# Patient Record
Sex: Female | Born: 1965 | Race: White | Hispanic: No | Marital: Married
Health system: Midwestern US, Community
[De-identification: ages and names within clinical notes are randomized; demographics above are authoritative.]

## PROBLEM LIST (undated history)

## (undated) DIAGNOSIS — Z8709 Personal history of other diseases of the respiratory system: Secondary | ICD-10-CM

## (undated) DIAGNOSIS — N939 Abnormal uterine and vaginal bleeding, unspecified: Secondary | ICD-10-CM

## (undated) DIAGNOSIS — Z9889 Other specified postprocedural states: Secondary | ICD-10-CM

## (undated) DIAGNOSIS — D219 Benign neoplasm of connective and other soft tissue, unspecified: Secondary | ICD-10-CM

## (undated) DIAGNOSIS — R351 Nocturia: Secondary | ICD-10-CM

## (undated) DIAGNOSIS — M1990 Unspecified osteoarthritis, unspecified site: Secondary | ICD-10-CM

## (undated) DIAGNOSIS — R112 Nausea with vomiting, unspecified: Secondary | ICD-10-CM

## (undated) DIAGNOSIS — Z1231 Encounter for screening mammogram for malignant neoplasm of breast: Secondary | ICD-10-CM

## (undated) DIAGNOSIS — D259 Leiomyoma of uterus, unspecified: Secondary | ICD-10-CM

## (undated) DIAGNOSIS — D250 Submucous leiomyoma of uterus: Secondary | ICD-10-CM

## (undated) DIAGNOSIS — R928 Other abnormal and inconclusive findings on diagnostic imaging of breast: Secondary | ICD-10-CM

## (undated) DIAGNOSIS — N938 Other specified abnormal uterine and vaginal bleeding: Secondary | ICD-10-CM

## (undated) DIAGNOSIS — G8918 Other acute postprocedural pain: Secondary | ICD-10-CM

## (undated) HISTORY — DX: Benign neoplasm of connective and other soft tissue, unspecified: D21.9

---

## 1989-06-25 HISTORY — PX: SHOULDER ARTHROSCOPY: SHX128

## 2009-09-21 NOTE — Telephone Encounter (Signed)
Pt informed of mammo results.

## 2009-12-02 LAB — HCG URINE, QL. - POC: Pregnancy test,urine (POC): NEGATIVE

## 2009-12-02 MED ORDER — LIDOCAINE (PF) 10 MG/ML (1 %) IJ SOLN
10 mg/mL (1 %) | INTRAMUSCULAR | Status: DC | PRN
Start: 2009-12-02 — End: 2009-12-02

## 2009-12-02 MED ORDER — CEFAZOLIN 1 GRAM SOLUTION FOR INJECTION
1 gram | Freq: Once | INTRAMUSCULAR | Status: DC
Start: 2009-12-02 — End: 2009-12-02

## 2009-12-02 MED ORDER — NALOXONE 0.4 MG/ML INJECTION
0.4 mg/mL | INTRAMUSCULAR | Status: DC | PRN
Start: 2009-12-02 — End: 2009-12-02

## 2009-12-02 MED ORDER — FLUMAZENIL 0.1 MG/ML IV SOLN
0.1 mg/mL | INTRAVENOUS | Status: DC | PRN
Start: 2009-12-02 — End: 2009-12-02

## 2009-12-02 MED ORDER — BUPIVACAINE (PF) 0.5 % (5 MG/ML) IJ SOLN
0.5 % (5 mg/mL) | Freq: Once | INTRAMUSCULAR | Status: AC | PRN
Start: 2009-12-02 — End: 2009-12-02
  Administered 2009-12-02: 16:00:00 via SUBCUTANEOUS

## 2009-12-02 MED ORDER — HYDROCODONE-ACETAMINOPHEN 5 MG-500 MG TAB
5-500 mg | Freq: Once | ORAL | Status: AC
Start: 2009-12-02 — End: 2009-12-02
  Administered 2009-12-02: 18:00:00 via ORAL

## 2009-12-02 MED ORDER — HYDROCODONE-ACETAMINOPHEN 5 MG-500 MG TAB
5-500 mg | ORAL_TABLET | ORAL | Status: DC | PRN
Start: 2009-12-02 — End: 2012-09-15

## 2009-12-02 MED ORDER — ONDANSETRON (PF) 4 MG/2 ML INJECTION
4 mg/2 mL | INTRAMUSCULAR | Status: DC | PRN
Start: 2009-12-02 — End: 2009-12-02

## 2009-12-02 MED ADMIN — lactated ringers infusion: INTRAVENOUS | @ 15:00:00 | NDC 00409795309

## 2009-12-02 MED ADMIN — cefazolin (ANCEF) 1 g in 0.9% sodium chloride (MBP/ADV) 50 mL MBP: INTRAVENOUS | @ 16:00:00 | NDC 00781345170

## 2009-12-02 MED FILL — BUPIVACAINE (PF) 0.5 % (5 MG/ML) IJ SOLN: 0.5 % (5 mg/mL) | INTRAMUSCULAR | Qty: 30

## 2009-12-02 MED FILL — MORPHINE 10 MG/ML SYRINGE: 10 mg/mL | INTRAMUSCULAR | Qty: 1

## 2009-12-02 MED FILL — HYDROCODONE-ACETAMINOPHEN 5 MG-500 MG TAB: 5-500 mg | ORAL | Qty: 1

## 2009-12-02 MED FILL — MIDAZOLAM 1 MG/ML IJ SOLN: 1 mg/mL | INTRAMUSCULAR | Qty: 2

## 2009-12-02 MED FILL — LACTATED RINGERS IV: INTRAVENOUS | Qty: 1000

## 2009-12-02 MED FILL — CEFAZOLIN 1 GRAM SOLUTION FOR INJECTION: 1 gram | INTRAMUSCULAR | Qty: 1000

## 2009-12-02 MED FILL — BUPIVACAINE-EPINEPHRINE (PF) 0.5 %-1:200,000 IJ SOLN: 0.5 %-1:200,000 | INTRAMUSCULAR | Qty: 30

## 2009-12-02 MED FILL — FENTANYL CITRATE (PF) 50 MCG/ML IJ SOLN: 50 mcg/mL | INTRAMUSCULAR | Qty: 2

## 2009-12-02 NOTE — Progress Notes (Signed)
Post-Anesthesia Evaluation & Assessment    Visit Vitals   Item Reading   ??? BP 146/30   ??? Pulse 52   ??? Temp 97.7 ??F (36.5 ??C)   ??? Resp 12   ??? Ht 5\' 4"  (1.626 m)   ??? Wt 135 lb 4 oz (61.349 kg)   ??? BMI 23.22 kg/m2   ??? SpO2 100%         Nausea/Vomiting: no nausea and no vomiting    Post-operative hydration adequate.    Pain score (VAS): 2    Mental status & Level of consciousness: alert and oriented x 3    Neurological status: moves all extremities, sensation grossly intact    Pulmonary status: airway patent, no supplemental oxygen required    Complications related to anesthesia: none    Patient has met all discharge requirements.    Additional comments:        Satira Mccallum, MD

## 2009-12-02 NOTE — Op Note (Signed)
#  [id number]  Dictated

## 2009-12-02 NOTE — Brief Op Note (Signed)
BRIEF OPERATIVE NOTE    Date of Procedure: 12/02/2009   Preoperative Diagnosis: UMBILICAL HERNIA  Postoperative Diagnosis: UMBILICAL HERNIA    Procedure:  HERNIA UMBILICAL REPAIR - REPAIR UMBILICAL HERNIA    Surgeon: Michel Bickers, MD  Assistant(s):    Anesthesia: General   Estimated Blood Loss: minimal  Specimens:   ID Type Source Tests Collected by Time Destination   1 : hernia sac Preservative Hernia Sac  Michel Bickers 12/02/2009 1202 Pathology      Findings: See full operative note.  Complications: none  Implants: * No implants in log *

## 2009-12-02 NOTE — Other (Signed)
1115 forced warm air gown on patient.  Patient controlled temperature device explained and given to patient.

## 2009-12-02 NOTE — Progress Notes (Signed)
12/02/2009    Chart reviewed and patient interviewed. No new changes since current H&P.

## 2009-12-02 NOTE — Op Note (Signed)
Name: Laura Bradley, Laura Bradley  MR #: [id number] Surgeon: Barbette Or,   MD  Account #: [id number] Surgery Date: 12/02/2009  DOB: 01-Dec-1965  Age: 44 Location: [facility]     OPERATIVE REPORT        POSTOPERATIVE DIAGNOSIS: Umbilical hernia.    POSTOPERATIVE DIAGNOSIS: Umbilical hernia.    OPERATIVE PROCEDURE: Repair of umbilical hernia.    SURGEON: Barbette Or, MD    ANESTHESIA: General. Anesthesiologist Dr. ________ .    COMPLICATIONS    ESTIMATED BLOOD LOSS: Minimal.    FINDINGS: The patient had a small umbilical hernia containing omentum. The  defect in the fascia was less than 5 mm in diameter.    DESCRIPTION OF PROCEDURE: Under general anesthesia, the patient was supine  on the operating table. The abdomen was cleaned, prepped, and draped.    A subumbilical incision was made and deepened through subcutaneous tissue.  The hernia was clearly identified. The hernia was separated from the  fascia. The margins of the fascia were clearly defined. The sac was opened  and excess sac and omentum was excised and sent to histopathology.    The margins of the fascial defect were cleared and it was noted that the  defect was very small and therefore it was chosen not to use mesh. Primary  repair with figure-of-eight suture of 0 PDS was performed.    The umbilical dimple was refashioned and the wound was closed with  subcutaneous 3-0 Monocryl and subcuticular 4-0 Monocryl with Dermabond.    The patient tolerated the procedure well.    Counts were correct at the completion of surgery.    SPECIMENS EXCISED: Sac.              Barbette Or, MD    cc: Barbette Or, MD        VS/wmx; D: 12/02/2009 12:09 P; T: 12/02/2009 1:32 P; DOC# [id number]; Job#  [id number]

## 2009-12-02 NOTE — Op Note (Signed)
#  [id number]  Dictated

## 2009-12-02 NOTE — Op Note (Signed)
Op Notes signed by Michel Bickers, MD at 12/06/09 1025                 Author: Michel Bickers, MD  Service: --  Author Type: Physician       Filed: 12/06/09 1025  Date of Service: 12/02/09 1332  Status: Signed          Editor: Michel Bickers, MD (Physician)          <!--EPICS--> <BR> Name:      Laura Bradley, Laura Bradley<BR> MR #:      [id number]                    Surgeon:        Barbette Or, <BR> MD<BR> Account  #: [id number]                 Surgery Date:   12/02/2009<BR> DOB:       1965/11/10<BR> Age:       44                           Location:       [facility]<BR> <BR>                              OPERATIVE REPORT<BR> <BR> <BR> <BR> POSTOPERATIVE DIAGNOSIS:   Umbilical hernia.<BR> <BR> POSTOPERATIVE DIAGNOSIS:  Umbilical hernia.<BR> <BR> OPERATIVE PROCEDURE:  Repair of umbilical hernia.<BR> <BR> SURGEON:  Venkataraman Jaiyla Granados, MD<BR> <BR> ANESTHESIA:  General. Anesthesiologist Dr. ________ .<BR> <BR> COMPLICATIONS<BR>  <BR> ESTIMATED BLOOD LOSS:  Minimal.<BR> <BR> FINDINGS:  The patient had a small umbilical hernia containing omentum. The<BR> defect in the fascia was less than 5 mm in diameter.<BR> <BR> DESCRIPTION OF PROCEDURE:  Under general anesthesia, the patient  was supine<BR> on the operating table. The abdomen was cleaned, prepped, and draped.<BR> <BR> A subumbilical incision was made and deepened through subcutaneous tissue.<BR> The hernia was clearly identified. The hernia was separated from the<BR> fascia.  The margins of the fascia were clearly defined. The sac was opened<BR> and excess sac and omentum was excised and sent to histopathology.<BR> <BR> The margins of the fascial defect were cleared and it was noted that the<BR> defect was very small and therefore  it was chosen not to use mesh. Primary<BR> repair with figure-of-eight suture of 0 PDS was performed.<BR> <BR> The umbilical dimple was refashioned and the wound was closed with<BR> subcutaneous 3-0 Monocryl and subcuticular 4-0  Monocryl with Dermabond.<BR>  <BR> The patient tolerated the procedure well.<BR> <BR> Counts were correct at the completion of surgery.<BR> <BR> SPECIMENS EXCISED:  Sac.<BR> <BR> <BR> <BR> <BR> <BR> <BR> Venkataraman Khloei Spiker, MD<BR> <BR> cc:   Barbette Or, MD<BR> <BR> <BR>  <BR> VS/wmx; D: 12/02/2009 12:09 P; T: 12/02/2009  1:32 P; DOC# [id number]; Job#<BR> [id number]<BR> <!--EPICE-->

## 2009-12-03 MED FILL — DIPRIVAN 10 MG/ML INTRAVENOUS EMULSION: 10 mg/mL | INTRAVENOUS | Qty: 20

## 2011-09-03 ENCOUNTER — Encounter

## 2012-06-25 HISTORY — PX: HAND TENDON SURGERY: SHX663

## 2012-09-15 MED ADMIN — lidocaine (XYLOCAINE) 10 mg/mL (1 %) injection 20 mL: INTRADERMAL | @ 16:00:00 | NDC 00409427601

## 2012-09-15 MED ADMIN — diph,Pertuss(AC),Tet Vac-PF (BOOSTRIX) suspension 0.5 mL: INTRAMUSCULAR | @ 16:00:00 | NDC 58160084243

## 2012-09-15 MED ADMIN — neomycin-bacitracin-polymyxin (NEOSPORIN) ointment 1 Packet: TOPICAL | @ 17:00:00 | NDC 45802006170

## 2012-09-15 NOTE — ED Provider Notes (Signed)
HPI Comments: 47 year old female states that she cut finger on paper cutter no N/T still has ROM to 5th finger on the right     Patient is a 47 y.o. female presenting with skin laceration. The history is provided by the patient.   Laceration   The incident occurred less than 1 hour ago. Pain location: rigth 5th finger. The laceration is 2 cm in size. Injury mechanism: paper cutter. The pain is at a severity of 3/10. The pain is mild. Pertinent negatives include no numbness. The patient's last tetanus shot was 5 to 10 years ago.       Past Medical History   Diagnosis Date   ??? Nausea & vomiting         Past Surgical History   Procedure Laterality Date   ??? Hx gyn     ??? Hx gi       hernia   ??? Hx orthopaedic       shoulder         Family History   Problem Relation Age of Onset   ??? Diabetes Father    ??? Heart Disease Maternal Grandmother    ??? Stroke Maternal Grandfather         History     Social History   ??? Marital Status: MARRIED     Spouse Name: N/A     Number of Children: N/A   ??? Years of Education: N/A     Occupational History   ??? Not on file.     Social History Main Topics   ??? Smoking status: Never Smoker    ??? Smokeless tobacco: Never Used   ??? Alcohol Use: 0.5 oz/week     1 Glasses of wine per week   ??? Drug Use: No   ??? Sexually Active: Not on file     Other Topics Concern   ??? Not on file     Social History Narrative   ??? No narrative on file                  ALLERGIES: Percodan      Review of Systems   Constitutional: Negative for appetite change and unexpected weight change.   HENT: Negative for facial swelling, rhinorrhea, trouble swallowing, neck pain, dental problem and ear discharge.    Eyes: Negative for pain and discharge.   Respiratory: Negative for apnea and stridor.    Cardiovascular: Negative for chest pain, palpitations and leg swelling.   Gastrointestinal: Negative for vomiting, abdominal pain, diarrhea, blood in stool and abdominal distention.   Genitourinary: Negative for dysuria, hematuria, flank pain  and difficulty urinating.   Musculoskeletal: Negative for myalgias, back pain, joint swelling and arthralgias.   Skin: Positive for wound. Negative for color change and rash.   Neurological: Negative for facial asymmetry, speech difficulty, numbness and headaches.   Hematological: Negative for adenopathy.   Psychiatric/Behavioral: Negative for suicidal ideas, hallucinations, behavioral problems, self-injury and agitation.       Filed Vitals:    09/15/12 1058   BP: 149/73   Pulse: 69   Temp: 98.1 ??F (36.7 ??C)   Resp: 18   Height: 5\' 4"  (1.626 m)   Weight: 62.596 kg (138 lb)   SpO2: 97%            Physical Exam   Nursing note and vitals reviewed.  Constitutional: She is oriented to person, place, and time. She appears well-developed and well-nourished. She appears distressed.   HENT:  Head: Normocephalic and atraumatic.   Eyes: Conjunctivae and EOM are normal. Pupils are equal, round, and reactive to light. Right eye exhibits no discharge. Left eye exhibits no discharge. No scleral icterus.   Neck: Normal range of motion.   Musculoskeletal: Normal range of motion. She exhibits tenderness. She exhibits no edema.        Right hand: She exhibits laceration.        Hands:  Neurological: She is alert and oriented to person, place, and time. No cranial nerve deficit. Coordination normal.   Skin: Skin is warm. No erythema.        MDM     Amount and/or Complexity of Data Reviewed:   Discussion of test results with the performing providers:  Yes  Progress:   Patient progress:  Stable      Wound Repair  Date/Time: 09/15/2012 12:12 PM  Performed by: PASupervising provider: Leeroy Bock  Preparation: skin prepped with Shur-Clens  Location details: right small finger  Wound length:2.5 cm or less  Anesthesia: local infiltration  Local anesthetic: lidocaine 1% without epinephrine  Anesthetic total: 4 ml  Irrigation solution: saline  Irrigation method: syringe  Skin closure: 5-0 nylon  Number of sutures: 7  Technique: simple and  interrupted  Approximation: close  Dressing: antibiotic ointment and splint  Patient tolerance: Patient tolerated the procedure well with no immediate complications.  My total time at bedside, performing this procedure was 16-30 minutes.        Will follow up with with ortho for further follow up will splint with finger splint and dress wound

## 2012-09-15 NOTE — ED Provider Notes (Signed)
I was personally available for consultation in the emergency department.  I have reviewed the chart and agree with the documentation recorded by the MLP, including the assessment, treatment plan, and disposition.  Makana Rostad H Amamda Curbow, MD

## 2012-09-15 NOTE — ED Notes (Signed)
I have reviewed discharge instructions with the patient. The patient verbalized understanding. Opportunity to ask questions given. No questions at this time.  Script(s) given to patient.  Armband removed and confidentially discarded.    Patient ambulatory out of ED.

## 2012-09-15 NOTE — ED Notes (Signed)
Dry dressing with finger splint applied.

## 2012-09-15 NOTE — ED Notes (Signed)
Pt states she 'sliced her finger with a large paper cutter' this morning.  Pt c/o laceration to right 5th finger, bleeding controlled in triage, pt has applied gauze wrap PTA. Pt states her last tetanus with greater than 5 years ago.

## 2012-12-16 ENCOUNTER — Encounter

## 2012-12-17 NOTE — Progress Notes (Signed)
Annual exam ages 95-64    Laura Bradley is a G4 P2022,  47 y.o. female WHITE [employer] whose Patient's last menstrual period was 11/25/2012.  was on 11/25/2012 who presents for her annual checkup.     She is having BTB and RLQ Pain.  Has discharge that is pink tinged - has had this for last few years, no change.  Occurs midcycle.  RLQ pain is also midcycle.  Both c/w ovulation.  Had US done 10/2011 which showed nl ovaries bilaterally.  Uterine fibroid ~5cm, ?submucosal.    With regard to the Gardasil vaccine, she is older than the age for which it is FDA approved.    Menstrual status:    Her periods are moderate in flow, occasionally heavy with clots. She is using "depending on how heavy" pads or tampons per day, current BTB x 4 days - spotty d/c per pt. Midcycle as above.    She has dysmenorrhea.    She has premenstrual symptoms.    Contraception:    The current method of family planning is vasectomy.    Sexual history:     reports that she currently engages in sexual activity. She reports using the following method of birth control/protection: None.    Medical conditions:    Since her last annual GYN exam about one year ago,  she has the following changes in her health history: none.     Pap and Mammogram History:    Her most recent Pap smear was normal, HPV was negative, obtained on 09/02/08.    The patient had a recent mammogram yesterday which results are pending from [facility].    Osteoporosis History:    Family history does not include a first or second degree relative with osteopenia or osteoporosis.      Past Medical History   Diagnosis Date   ??? Nausea & vomiting    ??? Screening for malignant neoplasm of the cervix 09/02/08     Negative ; HPV Negative   ??? Hx of mammogram 12/16/12     Results pending   ??? Infertility      H/o   ??? Ectopic pregnancy      H/o x 2 on right side     Past Surgical History   Procedure Laterality Date   ??? Hx gi       hernia   ??? Hx orthopaedic       shoulder   ??? Hx cesarean  section       x 2     OB History    Grav Para Term Preterm Abortions TAB SAB Ect Mult Living    4 2 2  2   2  2        # Outc Date GA Lbr Len/2nd Wgt Sex Del Anes PTL Lv    1 TRM 5/97 [redacted]w[redacted]d  9lb9oz(4.338kg) F C-SECTION CL Spinal No Yes    Comments: Breech    2 TRM 9/00 [redacted]w[redacted]d  8lb3oz(3.714kg) M C-SECTION CL Spinal No Yes    Comments: Breech    3 ECT             4 ECT                   Current Outpatient Prescriptions   Medication Sig Dispense Refill   ??? magnesium 250 mg tab Take  by mouth.       ??? HYDROcodone-acetaminophen (NORCO) 5-325 mg per tablet Take 1 Tab by mouth  every six (6) hours as needed for Pain.  12 Tab  0   ??? magnesium oxide (MAG-OX) 400 mg tablet Take 400 mg by mouth daily.       ??? multivitamin, stress formula (STRESS TAB) tablet Take 1 Tab by mouth daily.         Allergies: Percodan   History     Social History   ??? Marital Status: MARRIED     Spouse Name: N/A     Number of Children: N/A   ??? Years of Education: N/A     Occupational History   ??? Not on file.     Social History Main Topics   ??? Smoking status: Never Smoker    ??? Smokeless tobacco: Never Used   ??? Alcohol Use: 0.5 oz/week     1 Glasses of wine per week   ??? Drug Use: No   ??? Sexually Active: Yes -- Female partner(s)     Birth Control/ Protection: None      Comment: Vasectomy     Other Topics Concern   ??? Not on file     Social History Narrative   ??? No narrative on file     Tobacco History:  reports that she has never smoked. She has never used smokeless tobacco.  Alcohol Abuse:  reports that she drinks about 0.5 ounces of alcohol per week.  Drug Abuse:  reports that she does not use illicit drugs.    There is no problem list on file for this patient.    Family History   Problem Relation Age of Onset   ??? Diabetes Father    ??? Heart Disease Maternal Grandmother    ??? Stroke Maternal Grandfather    ??? Breast Cancer Neg Hx        Review of Systems - History obtained from the patient  Constitutional: negative for weight loss, fever, night sweats  HEENT:  negative for hearing loss, earache, congestion, snoring, sorethroat  CV: negative for chest pain, palpitations, edema  Resp: negative for cough, shortness of breath, wheezing  GI: negative for change in bowel habits, abdominal pain, black or bloody stools  GU: negative for frequency, dysuria, hematuria, vaginal discharge  MSK: negative for back pain, joint pain, muscle pain  Breast: negative for breast lumps, nipple discharge, galactorrhea  Skin :negative for itching, rash, hives  Neuro: negative for dizziness, headache, confusion, weakness  Psych: negative for anxiety, depression, change in mood  Heme/lymph: negative for bleeding, bruising, pallor    Physical Exam    BP 122/62   Ht 5\' 4"  (1.626 m)   Wt 138 lb (62.596 kg)   BMI 23.68 kg/m2   LMP 11/25/2012    Constitutional  ?? Appearance: well-nourished, well developed, alert, in no acute distress    HENT  ?? Head and Face: appears normal    Neck  ?? Inspection/Palpation: normal appearance, no masses or tenderness  ?? Lymph Nodes: no lymphadenopathy present  ?? Thyroid: gland size normal, nontender, no nodules or masses present on palpation    Chest  ?? Respiratory Effort: breathing unlabored  ?? Auscultation: normal breath sounds    Cardiovascular  ?? Heart:  ?? Auscultation: regular rate and rhythm without murmur    Breasts  ?? Inspection of Breasts: breasts symmetrical, no skin changes, no discharge present, nipple appearance normal, no skin retraction present  ?? Palpation of Breasts and Axillae: no masses present on palpation, no breast tenderness  ?? Axillary Lymph Nodes: no lymphadenopathy present  Gastrointestinal  ?? Abdominal Examination: abdomen non-tender to palpation, normal bowel sounds, no masses present  ?? Liver and spleen: no hepatomegaly present, spleen not palpable  ?? Hernias: no hernias identified    Genitourinary  ?? External Genitalia: normal appearance for age, no discharge present, no tenderness present, no inflammatory lesions present, no masses  present, no atrophy present  ?? Vagina: normal vaginal vault without central or paravaginal defects, no discharge present, no inflammatory lesions present, no masses present  ?? Bladder: non-tender to palpation  ?? Urethra: appears normal  ?? Cervix: normal   ?? Uterus: upper normal size, with right fundal fibroid  ?? Adnexa: no adnexal tenderness present, no adnexal masses present  ?? Perineum: perineum within normal limits, no evidence of trauma, no rashes or skin lesions present  ?? Anus: anus within normal limits, no hemorrhoids present  ?? Inguinal Lymph Nodes: no lymphadenopathy present    Skin  ?? General Inspection: no rash, no lesions identified    Neurologic/Psychiatric  ?? Mental Status:  ?? Orientation: grossly oriented to person, place and time  ?? Mood and Affect: mood normal, affect appropriate    Assessment & Plan:  ?? Routine gynecologic examination.  Pap/HPV done.  ?? Her current medical status is satisfactory with no evidence of significant gynecologic issues.  ?? Some midcycle increased discharge, slight pinkish, and RLQ discomfort, all c/w ovulation  ?? Counseled re: diet, exercise, healthy lifestyle  ?? Return for yearly wellness visits  ?? Rec annual mammogram.  Done yesterday.           Orders Placed This Encounter   ??? PAP IG, HPV AND RFX HPV [phone])

## 2012-12-20 LAB — PAP IG, HPV AND RFX HPV 16/18,45(507815)
.: 0
HPV DNA Probe, High Risk: NEGATIVE
HPV, High Risk: NEGATIVE
LABCORP 019018: 0

## 2014-01-08 ENCOUNTER — Encounter

## 2014-01-19 NOTE — Progress Notes (Signed)
Annual exam ages 42-39    Laura Bradley is a G4 P2022,  48 y.o. female [place] No LMP recorded., who presents for her annual checkup.     She is having no significant problems.  Periods are better.  Using essential oils which have helped.    With regard to the Gardasil vaccine, she is older than the FDA approved age to receive it.      Menstrual status:    Her periods are light, moderate in flow. She is using three to five pads or tampons per day, usually regular and last 26-30 days.    She denies dysmenorrhea.    She denies premenstrual symptoms.    Contraception:    The current method of family planning is vasectomy.    Sexual history:     She  reports that she currently engages in sexual activity and has had female partners. She reports using the following method of birth control/protection: None..    Medical conditions:    Since her last annual GYN exam about one year ago, she has not the following changes in her health history: none.     Pap and Mammogram History:    Her most recent Pap smear was normal, HPV was negative, obtained on 12/17/12.  She does not have a history of abnormal paps.    The patient had a recent mammogram on 12/16/12 which was negative for malignancy.  Has appt scheduled for next week.    Breast Cancer History/Substance Abuse:    Past Medical History   Diagnosis Date   ??? Nausea & vomiting    ??? Screening for malignant neoplasm of the cervix 12/17/12     Negative ; HPV Negative   ??? Hx of mammogram 12/16/12     Results pending   ??? Infertility      H/o   ??? Ectopic pregnancy      H/o x 2 on right side   ??? Anemia    ??? Pre-diabetes    ??? Vitamin D deficiency      Past Surgical History   Procedure Laterality Date   ??? Hx gi       hernia   ??? Hx orthopaedic       shoulder   ??? Hx cesarean section       x 2     OB History   Gravida Para Term Preterm AB SAB TAB Ectopic Multiple Living   4 2 2  2   2  2       # Outcome Date GA Lbr Len/2nd Weight Sex Delivery Anes PTL Lv    4 Term 03/09/99 [redacted]w[redacted]d  8 lb 3 oz (3.714 kg) M C-SECTION CL Spinal N Y      Comments: Breech   3 Term 11/02/95 [redacted]w[redacted]d  9 lb 9 oz (4.338 kg) F C-SECTION CL Spinal N Y      Comments: Breech   2 Ectopic            1 Ectopic                   Current Outpatient Prescriptions   Medication Sig Dispense Refill   ??? cholecalciferol, vitamin D3, (VITAMIN D3) 2,000 unit tab Take  by mouth.     ??? ferrous sulfate (IRON) 325 mg (65 mg iron) tablet Take  by mouth Daily (before breakfast).     ??? magnesium 250 mg tab Take  by mouth.     ???  magnesium oxide (MAG-OX) 400 mg tablet Take 400 mg by mouth daily.     ??? multivitamin, stress formula (STRESS TAB) tablet Take 1 Tab by mouth daily.     ??? HYDROcodone-acetaminophen (NORCO) 5-325 mg per tablet Take 1 Tab by mouth every six (6) hours as needed for Pain. 12 Tab 0     Allergies: Percodan   History     Social History   ??? Marital Status: MARRIED     Spouse Name: N/A     Number of Children: N/A   ??? Years of Education: N/A     Occupational History   ??? Not on file.     Social History Main Topics   ??? Smoking status: Never Smoker    ??? Smokeless tobacco: Never Used   ??? Alcohol Use: 0.5 oz/week     1 Glasses of wine per week   ??? Drug Use: No   ??? Sexual Activity:     Partners: Male     Patent examiner Protection: None      Comment: Vasectomy     Other Topics Concern   ??? Not on file     Social History Narrative     Tobacco History:  reports that she has never smoked. She has never used smokeless tobacco.  Alcohol Abuse:  reports that she drinks about 0.5 oz of alcohol per week.  Drug Abuse:  reports that she does not use illicit drugs.    There is no problem list on file for this patient.    Family History   Problem Relation Age of Onset   ??? Diabetes Father    ??? Heart Disease Maternal Grandmother    ??? Stroke Maternal Grandfather    ??? Breast Cancer Neg Hx        Review of Systems - History obtained from the patient  Constitutional: negative for weight loss, fever, night sweats   HEENT: negative for hearing loss, earache, congestion, snoring, sorethroat  CV: negative for chest pain, palpitations, edema  Resp: negative for cough, shortness of breath, wheezing  GI: negative for change in bowel habits, abdominal pain, black or bloody stools  GU: negative for frequency, dysuria, hematuria, vaginal discharge  MSK: negative for back pain, joint pain, muscle pain  Breast: negative for breast lumps, nipple discharge, galactorrhea  Skin :negative for itching, rash, hives  Neuro: negative for dizziness, headache, confusion, weakness  Psych: negative for anxiety, depression, change in mood  Heme/lymph: negative for bleeding, bruising, pallor    Physical Exam    BP 122/64 mmHg   Ht 5\' 4"  (1.626 m)   Wt 140 lb (63.504 kg)   BMI 24.02 kg/m2   LMP 01/09/2014 (Exact Date)    Constitutional  ?? Appearance: well-nourished, well developed, alert, in no acute distress    HENT  ?? Head and Face: appears normal    Neck  ?? Inspection/Palpation: normal appearance, no masses or tenderness  ?? Lymph Nodes: no lymphadenopathy present  ?? Thyroid: gland size normal, nontender, no nodules or masses present on palpation    Chest  ?? Respiratory Effort: breathing unlabored  ?? Auscultation: normal breath sounds    Cardiovascular  ?? Heart:  ?? Auscultation: regular rate and rhythm without murmur    Breasts  ?? Inspection of Breasts: breasts symmetrical, no skin changes, no discharge present, nipple appearance normal, no skin retraction present  ?? Palpation of Breasts and Axillae: no masses present on palpation, no breast tenderness  ?? Axillary Lymph Nodes:  no lymphadenopathy present    Gastrointestinal  ?? Abdominal Examination: abdomen non-tender to palpation, normal bowel sounds, no masses present  ?? Liver and spleen: no hepatomegaly present, spleen not palpable  ?? Hernias: no hernias identified    Genitourinary  ?? External Genitalia: normal appearance for age, no discharge present, no  tenderness present, no inflammatory lesions present, no masses present, no atrophy present  ?? Vagina: normal vaginal vault without central or paravaginal defects, no discharge present, no inflammatory lesions present, no masses present  ?? Bladder: non-tender to palpation  ?? Urethra: appears normal  ?? Cervix: normal   ?? Uterus: normal size, shape and consistency  ?? Adnexa: no adnexal tenderness present, no adnexal masses present  ?? Perineum: perineum within normal limits, no evidence of trauma, no rashes or skin lesions present  ?? Anus: anus within normal limits, no hemorrhoids present  ?? Inguinal Lymph Nodes: no lymphadenopathy present    Skin  ?? General Inspection: no rash, no lesions identified    Neurologic/Psychiatric  ?? Mental Status:  ?? Orientation: grossly oriented to person, place and time  ?? Mood and Affect: mood normal, affect appropriate        Assessment & Plan:  ?? Routine gynecologic examination.  Pap/HPV neg 11/2012, rpt 15yrs.  ?? Her current medical status is satisfactory with no evidence of significant gynecologic issues.  ?? Counseled re: diet, exercise, healthy lifestyle  ?? Return for yearly wellness visits  ?? Pt counseled regarding co-testing for high risk HPV with pap  ?? Rec screening mammo.  Scheduled for next week

## 2014-01-19 NOTE — Patient Instructions (Signed)
Well Visit, Ages 18 to 50: After Your Visit  Your Care Instructions  Physical exams can help you stay healthy. Your doctor has checked your overall health and may have suggested ways to take good care of yourself. He or she also may have recommended tests. At home, you can help prevent illness with healthy eating, regular exercise, and other steps.  Follow-up care is a key part of your treatment and safety. Be sure to make and go to all appointments, and call your doctor if you are having problems. It's also a good idea to know your test results and keep a list of the medicines you take.  How can you care for yourself at home?  ?? Reach and stay at a healthy weight. This will lower your risk for many problems, such as obesity, diabetes, heart disease, and high blood pressure.  ?? Get at least 30 minutes of physical activity on most days of the week. Walking is a good choice. You also may want to do other activities, such as running, swimming, cycling, or playing tennis or team sports. Discuss any changes in your exercise program with your doctor.  ?? Do not smoke or allow others to smoke around you. If you need help quitting, talk to your doctor about stop-smoking programs and medicines. These can increase your chances of quitting for good.  ?? Talk to your doctor about whether you have any risk factors for sexually transmitted infections (STIs). Having one sex partner (who does not have STIs and does not have sex with anyone else) is a good way to avoid these infections.  ?? Use birth control if you do not want to have children at this time. Talk with your doctor about the choices available and what might be best for you.  ?? Always wear sunscreen on exposed skin. Make sure the sunscreen blocks ultraviolet rays (both UVA and UVB) and has a sun protection factor (SPF) of at least 15. Use it every day, even when it is cloudy. Some doctors may recommend a higher SPF, such as 30.   ?? See a dentist one or two times a year for checkups and to have your teeth cleaned.  ?? Wear a seat belt in the car.  ?? Drink alcohol in moderation, if at all. That means no more than 2 drinks a day for men and 1 drink a day for women.  Follow your doctor's advice about when to have certain tests. These tests can spot problems early.  For everyone  ?? Cholesterol. Have the fat (cholesterol) in your blood tested after age 20. Your doctor will tell you how often to have this done based on your age, family history, or other things that can increase your risk for heart disease.  ?? Blood pressure. Experts suggest that healthy adults with normal blood pressure (119/79 mm Hg or below) have their blood pressure checked at least every 1 to 2 years. This can be done during a routine doctor visit. If you have slightly higher or high blood pressure, your doctor will suggest more frequent tests.  ?? Vision. Talk with your doctor about how often to have a glaucoma test.  ?? Diabetes. Ask your doctor whether you should have tests for diabetes.  ?? Colon cancer. Have a test for colon cancer at age 50. You may have one of several tests. If you are younger than 50, you may need a test earlier if you have any risk factors. Risk factors include whether you already   had a precancerous polyp removed from your colon or whether your parent, brother, sister, or child has had colon cancer.  For women  ?? Breast exam and mammogram. Talk to your doctor about when you should have a clinical breast exam and a mammogram. Medical experts differ on whether and how often women under 50 should have these tests. Your doctor can help you decide what is right for you.  ?? Pap test and pelvic exam. Begin Pap tests at age 21. A Pap test is the best way to find cervical cancer. The test often is part of a pelvic exam. Ask how often to have this test.  ?? Tests for sexually transmitted infections (STIs). Ask whether you should  have tests for STIs. You may be at risk if you have sex with more than one person, especially if your partners do not wear condoms.  For men  ?? Tests for sexually transmitted infections (STIs). Ask whether you should have tests for STIs. You may be at risk if you have sex with more than one person, especially if you do not wear a condom.  ?? Testicular cancer exam. Ask your doctor whether you should check your testicles regularly.  ?? Prostate exam. Talk to your doctor about whether you should have a blood test (called a PSA test) for prostate cancer. Experts differ on whether and when men should have this test. Some experts suggest it if you are older than 45 and are African-American or have a father or brother who got prostate cancer when he was younger than 65.  When should you call for help?  Watch closely for changes in your health, and be sure to contact your doctor if you have any problems or symptoms that concern you.   Where can you learn more?   Go to http://www.healthwise.net/[facility]  Enter P072 in the search box to learn more about "Well Visit, Ages 18 to 50: After Your Visit."   ?? 2006-2015 Healthwise[employer] Inc[employer]. Care instructions adapted under license by [facility] (which disclaims liability or warranty for this information). This care instruction is for use with your licensed healthcare professional. If you have questions about a medical condition or this instruction, always ask your healthcare professional. Healthwise, Incorporated disclaims any warranty or liability for your use of this information.  Content Version: 10.5.422740; Current as of: August 14, 2013

## 2014-06-25 HISTORY — PX: UMBILICAL HERNIA REPAIR: SHX196

## 2015-01-26 ENCOUNTER — Encounter

## 2015-02-16 ENCOUNTER — Inpatient Hospital Stay: Admit: 2015-02-16 | Payer: BLUE CROSS/BLUE SHIELD | Attending: Obstetrics & Gynecology | Primary: Family Medicine

## 2015-02-16 ENCOUNTER — Encounter

## 2015-02-16 DIAGNOSIS — Z1231 Encounter for screening mammogram for malignant neoplasm of breast: Secondary | ICD-10-CM

## 2016-01-26 ENCOUNTER — Encounter

## 2016-02-13 ENCOUNTER — Ambulatory Visit
Admit: 2016-02-13 | Discharge: 2016-02-13 | Payer: PRIVATE HEALTH INSURANCE | Attending: Obstetrics & Gynecology | Primary: Family Medicine

## 2016-02-13 DIAGNOSIS — Z01419 Encounter for gynecological examination (general) (routine) without abnormal findings: Secondary | ICD-10-CM

## 2016-02-13 NOTE — Progress Notes (Signed)
Annual exam ages 71-64    [place] is a G4 P2022,  50 y.o. female Laura Bradley Patient's last menstrual period was 01/29/2016 (exact date)., who presents for her annual checkup.     Since her last annual GYN exam about two years ago,  she has the following changes in her health history: none.     ADDITIONAL CONCERNS:  She is having no significant problems.    With regard to the Gardasil vaccine, she is older than the age for which it is FDA approved.    Menstrual status:    Her periods are moderate in flow. She is using three to five pads or tampons per day, lasting for 7 days  without spotting.  Cycles are regular, every 4 weeks (had been every 3 weeks in past).    She denies dysmenorrhea.  Has h/o severe dysmenorrhea.  Much improved with essential oils (Doterra ClaryCalm - uses daily).    She denies premenstrual symptoms.    Contraception:    The current method of family planning is vasectomy.    Sexual history:     reports that she currently engages in sexual activity and has had female partners. She reports using the following method of birth control/protection: None.        Pap and Mammogram History:    Her most recent Pap smear was normal, HPV was negative, obtained on 12/17/12.    The patient had a recent mammogram 02/16/15 which was negative for malignancy.  Is scheduled 8/31 at [facility].    Osteoporosis History:    Family history does not include a first or second degree relative with osteopenia or osteoporosis.    Colonoscopy had in 2015 and reports WNL.     Past Medical History:   Diagnosis Date   ??? Anemia    ??? Ectopic pregnancy     H/o x 2 on right side   ??? Hx of mammogram 02/16/2015    Negative    ??? Infertility     H/o   ??? Nausea & vomiting    ??? Pre-diabetes    ??? Screening for malignant neoplasm of the cervix 12/17/12    Negative ; HPV Negative   ??? Vitamin D deficiency      Past Surgical History:   Procedure Laterality Date   ??? HX CESAREAN SECTION      x 2   ??? HX HERNIA REPAIR  2014    ??? HX ORTHOPAEDIC      Shoulder     OB History   Gravida Para Term Preterm AB Living   4 2 2  2 2    SAB TAB Ectopic Molar Multiple Live Births     2   2      # Outcome Date GA Lbr Len/2nd Weight Sex Delivery Anes PTL Lv   4 Term 03/09/99 [redacted]w[redacted]d  8 lb 3 oz (3.714 kg) M C-SECTION CL Spinal N LIV      Birth Comments: Breech   3 Term 11/02/95 [redacted]w[redacted]d  9 lb 9 oz (4.338 kg) F C-SECTION CL Spinal N LIV      Birth Comments: Breech   2 Ectopic            1 Ectopic                   Current Outpatient Prescriptions   Medication Sig Dispense Refill   ??? cholecalciferol, vitamin D3, (VITAMIN D3) 2,000 unit tab Take  by mouth.     ???  ferrous sulfate (IRON) 325 mg (65 mg iron) tablet Take  by mouth Daily (before breakfast).     ??? magnesium 250 mg tab Take  by mouth.     ??? magnesium oxide (MAG-OX) 400 mg tablet Take 400 mg by mouth daily.     ??? multivitamin, stress formula (STRESS TAB) tablet Take 1 Tab by mouth daily.     ??? HYDROcodone-acetaminophen (NORCO) 5-325 mg per tablet Take 1 Tab by mouth every six (6) hours as needed for Pain. 12 Tab 0     Allergies: Percodan [oxycodone hcl-oxycodone-asa]   Social History     Social History   ??? Marital status: MARRIED     Spouse name: N/A   ??? Number of children: N/A   ??? Years of education: N/A     Occupational History   ??? Not on file.     Social History Main Topics   ??? Smoking status: Never Smoker   ??? Smokeless tobacco: Never Used      Comment: Never used vapor or e-cigs    ??? Alcohol use 0.5 oz/week     1 Glasses of wine per week   ??? Drug use: No   ??? Sexual activity: Yes     Partners: Male     Birth control/ protection: None      Comment: Vasectomy     Other Topics Concern   ??? Not on file     Social History Narrative     Tobacco History:  reports that she has never smoked. She has never used smokeless tobacco.  Alcohol Abuse:  reports that she drinks about 0.5 oz of alcohol per week   Drug Abuse:  reports that she does not use illicit drugs.     There is no problem list on file for this patient.    Family History   Problem Relation Age of Onset   ??? Diabetes Father    ??? Heart Disease Maternal Grandmother    ??? Stroke Maternal Grandfather    ??? Breast Cancer Neg Hx        Review of Systems - History obtained from the patient  Constitutional: negative for weight loss, fever, night sweats  HEENT: negative for hearing loss, earache, congestion, snoring, sorethroat  CV: negative for chest pain, palpitations, edema  Resp: negative for cough, shortness of breath, wheezing  GI: negative for change in bowel habits, abdominal pain, black or bloody stools  GU: negative for frequency, dysuria, hematuria, vaginal discharge  MSK: negative for back pain, joint pain, muscle pain  Breast: negative for breast lumps, nipple discharge, galactorrhea  Skin :negative for itching, rash, hives  Neuro: negative for dizziness, headache, confusion, weakness  Psych: negative for anxiety, depression, change in mood  Heme/lymph: negative for bleeding, bruising, pallor    Physical Exam    Visit Vitals   ??? BP 128/72   ??? Pulse 60   ??? Ht 5\' 4"  (1.626 m)   ??? Wt 142 lb (64.4 kg)   ??? LMP 01/29/2016 (Exact Date)   ??? BMI 24.37 kg/m2       Constitutional  ?? Appearance: well-nourished, well developed, alert, in no acute distress    HENT  ?? Head and Face: appears normal    Neck  ?? Inspection/Palpation: normal appearance, no masses or tenderness  ?? Lymph Nodes: no lymphadenopathy present  ?? Thyroid: gland size normal, nontender, no nodules or masses present on palpation    Chest  ?? Respiratory Effort: breathing unlabored  ??  Auscultation: normal breath sounds    Cardiovascular  ?? Heart:  ?? Auscultation: regular rate and rhythm without murmur    Breasts  ?? Inspection of Breasts: breasts symmetrical, no skin changes, no discharge present, nipple appearance normal, no skin retraction present  ?? Palpation of Breasts and Axillae: no masses present on palpation, no breast tenderness   ?? Axillary Lymph Nodes: no lymphadenopathy present    Gastrointestinal  ?? Abdominal Examination: abdomen non-tender to palpation, normal bowel sounds, no masses present  ?? Liver and spleen: no hepatomegaly present, spleen not palpable  ?? Hernias: no hernias identified    Genitourinary  ?? External Genitalia: normal appearance for age, no discharge present, no tenderness present, no inflammatory lesions present, no masses present, no atrophy present  ?? Vagina: normal vaginal vault without central or paravaginal defects, no discharge present, no inflammatory lesions present, no masses present  ?? Bladder: non-tender to palpation  ?? Urethra: appears normal  ?? Cervix: normal   ?? Uterus: normal size, shape and consistency  ?? Adnexa: no adnexal tenderness present, no adnexal masses present  ?? Perineum: perineum within normal limits, no evidence of trauma, no rashes or skin lesions present  ?? Anus: anus within normal limits, no hemorrhoids present  ?? Inguinal Lymph Nodes: no lymphadenopathy present  RV:  No masses    Skin  ?? General Inspection: no rash, no lesions identified    Neurologic/Psychiatric  ?? Mental Status:  ?? Orientation: grossly oriented to person, place and time  ?? Mood and Affect: mood normal, affect appropriate        Assessment & Plan:  ?? Routine gynecologic examination.  Pap/HPV negative 11/2012, rpt q 5 years.  ?? Her current medical status is satisfactory with no evidence of significant gynecologic issues.  ?? H/o severe dysmenorrhea, doing well with Doterra ClaryCalm essential oils.  ?? Counseled re: diet, exercise, healthy lifestyle  ?? Return for yearly wellness visits  ?? Rec annual mammogram  ?? Patient verbalized understanding

## 2016-02-13 NOTE — Patient Instructions (Signed)
Well Visit, Women 50 to 65: Care Instructions  Your Care Instructions  Physical exams can help you stay healthy. Your doctor has checked your overall health and may have suggested ways to take good care of yourself. He or she also may have recommended tests. At home, you can help prevent illness with healthy eating, regular exercise, and other steps.  Follow-up care is a key part of your treatment and safety. Be sure to make and go to all appointments, and call your doctor if you are having problems. It's also a good idea to know your test results and keep a list of the medicines you take.  How can you care for yourself at home?  ?? Reach and stay at a healthy weight. This will lower your risk for many problems, such as obesity, diabetes, heart disease, and high blood pressure.  ?? Get at least 30 minutes of exercise on most days of the week. Walking is a good choice. You also may want to do other activities, such as running, swimming, cycling, or playing tennis or team sports.  ?? Do not smoke. Smoking can make health problems worse. If you need help quitting, talk to your doctor about stop-smoking programs and medicines. These can increase your chances of quitting for good.  ?? Protect your skin from too much sun. When you're outdoors from 10 a.m. to 4 p.m., stay in the shade or cover up with clothing and a hat with a wide brim. Wear sunglasses that block UV rays. Even when it's cloudy, put broad-spectrum sunscreen (SPF 30 or higher) on any exposed skin.  ?? See a dentist one or two times a year for checkups and to have your teeth cleaned.  ?? Wear a seat belt in the car.  ?? Limit alcohol to 1 drink a day. Too much alcohol can cause health problems.  Follow your doctor's advice about when to have certain tests. These tests can spot problems early.  ?? Cholesterol. Your doctor will tell you how often to have this done based on your age, family history, or other things that can increase your risk  for heart attack and stroke.  ?? Blood pressure. Have your blood pressure checked during a routine doctor visit. Your doctor will tell you how often to check your blood pressure based on your age, your blood pressure results, and other factors.  ?? Mammogram. Ask your doctor how often you should have a mammogram, which is an X-ray of your breasts. A mammogram can spot breast cancer before it can be felt and when it is easiest to treat.  ?? Pap test and pelvic exam. Ask your doctor how often you should have a Pap test. You may not need to have a Pap test as often as you used to.  ?? Vision. Have your eyes checked every year or two or as often as your doctor suggests. Some experts recommend that you have yearly exams for glaucoma and other age-related eye problems starting at age 50.  ?? Hearing. Tell your doctor if you notice any change in your hearing. You can have tests to find out how well you hear.  ?? Diabetes. Ask your doctor whether you should have tests for diabetes.  ?? Colon cancer. You should begin tests for colon cancer at age 50. You may have one of several tests. Your doctor will tell you how often to have tests based on your age and risk. Risks include whether you already had a precancerous polyp removed from   your colon or whether your parents, sisters and brothers, or children have had colon cancer.  ?? Thyroid disease. Talk to your doctor about whether to have your thyroid checked as part of a regular physical exam. Women have an increased chance of a thyroid problem.  ?? Osteoporosis. You should begin tests for bone density at age 65. If you are younger than 65, ask your doctor whether you have factors that may increase your risk for this disease. You may want to have this test before age 65.  ?? Heart attack and stroke risk. At least every 4 to 6 years, you should have your risk for heart attack and stroke assessed. Your doctor uses factors such as your age, blood pressure, cholesterol, and whether you  smoke or have diabetes to show what your risk for a heart attack or stroke is over the next 10 years.  When should you call for help?  Watch closely for changes in your health, and be sure to contact your doctor if you have any problems or symptoms that concern you.  Where can you learn more?  Go to http://www.healthwise.net/GoodHelp[employer]ctions.  Enter Y074 in the search box to learn more about "Well Visit, Women 50 to 65: Care Instructions."  Current as of: January 11, 2015  Content Version: 11.3  ?? 2006-2017 [employer]. Care instructions adapted under license by [employer] (which disclaims liability or warranty for this information). If you have questions about a medical condition or this instruction, always ask your healthcare professional. [employer]wise, Inc[employer] disclaims any warranty or liability for your use of this information.

## 2016-02-23 ENCOUNTER — Inpatient Hospital Stay: Admit: 2016-02-23 | Payer: BLUE CROSS/BLUE SHIELD | Attending: Obstetrics & Gynecology | Primary: Family Medicine

## 2016-02-23 DIAGNOSIS — Z1231 Encounter for screening mammogram for malignant neoplasm of breast: Secondary | ICD-10-CM

## 2016-06-25 HISTORY — PX: DILATION AND CURETTAGE OF UTERUS: SHX78

## 2016-11-05 ENCOUNTER — Ambulatory Visit
Admit: 2016-11-05 | Discharge: 2016-11-05 | Payer: PRIVATE HEALTH INSURANCE | Attending: Obstetrics & Gynecology | Primary: Family Medicine

## 2016-11-05 DIAGNOSIS — N939 Abnormal uterine and vaginal bleeding, unspecified: Secondary | ICD-10-CM

## 2016-11-05 MED ORDER — MEDROXYPROGESTERONE 10 MG TAB
10 mg | ORAL_TABLET | Freq: Every day | ORAL | 0 refills | Status: AC
Start: 2016-11-05 — End: 2016-11-15

## 2016-11-05 MED ORDER — MISOPROSTOL 200 MCG TAB
200 mcg | ORAL_TABLET | ORAL | 0 refills | Status: DC
Start: 2016-11-05 — End: 2017-01-04

## 2016-11-05 NOTE — Patient Instructions (Signed)
Abnormal Uterine Bleeding: Care Instructions  Your Care Instructions    Abnormal uterine bleeding (AUB) is irregular bleeding from the uterus that is longer or heavier than usual or does not occur at your regular time. Sometimes it is caused by changes in hormone levels. It can also be caused by growths in the uterus, such as fibroids or polyps. Sometimes a cause cannot be found.  You may have heavy bleeding when you are not expecting your period. Your doctor may suggest a pregnancy test, if you think you are pregnant.  Follow-up care is a key part of your treatment and safety. Be sure to make and go to all appointments, and call your doctor if you are having problems. It's also a good idea to know your test results and keep a list of the medicines you take.  How can you care for yourself at home?  ?? Be safe with medicines. Take pain medicines exactly as directed.  ?? If the doctor gave you a prescription medicine for pain, take it as prescribed.  ?? If you are not taking a prescription pain medicine, ask your doctor if you can take an over-the-counter medicine.  ?? You may be low in iron because of blood loss. Eat a balanced diet that is high in iron and vitamin C. Foods rich in iron include red meat, shellfish, eggs, beans, and leafy green vegetables. Talk to your doctor about whether you need to take iron pills or a multivitamin.  When should you call for help?  Call 911 anytime you think you may need emergency care. For example, call if:  ? ?? You passed out (lost consciousness).   ?Call your doctor now or seek immediate medical care if:  ? ?? You have new or worse belly or pelvic pain.   ? ?? You have severe vaginal bleeding.   ? ?? You feel dizzy or lightheaded, or you feel like you may faint.   ?Watch closely for changes in your health, and be sure to contact your doctor if:  ? ?? You think you may be pregnant.   ? ?? Your bleeding gets worse.   ? ?? You do not get better as expected.   Where can you learn more?   Go to http://www.healthwise.net/GoodHelpConnections.  Enter I327 in the search box to learn more about "Abnormal Uterine Bleeding: Care Instructions."  Current as of: April 07, 2015  Content Version: 11.4  ?? 2006-2017 [employer]. Care instructions adapted under license by [employer] (which disclaims liability or warranty for this information). If you have questions about a medical condition or this instruction, always ask your healthcare professional. [employer], [employer] disclaims any warranty or liability for your use of this information.

## 2016-11-05 NOTE — Progress Notes (Signed)
Abnormal bleeding note      JAELLA WEINERT is a 51 y.o. female who complains of vaginal bleeding problems.    Her current method of family planning is vasectomy.     She developed this problem approximately 1 month ago.  Period in April was 1-2wks late.    Since that cycle has had vaginal bleeding which she describes as light, spotting lasting up to today. Using only a liner daily. Describes it as a ''slow leak''.    Had some AUB in 2013.  Reports a previous attempt of EMB but unsuccessful even with cytotec. Returned to nl cycles, so EMB deferred.  Was advised if this happened again, may need to go to OR and obtained EMB then.            Associated symptoms: always has discharge, sometimes clear, sometimes yellowish, no odor, itching, irritation  Alleviating factors: none  Aggravating factors: none      The patient is sexually active.    Last Pap smear:was normal, HPV negative 11/2012    Her relevant past medical history:   Past Medical History:   Diagnosis Date   ??? Anemia    ??? Ectopic pregnancy     H/o x 2 on right side   ??? Hx of mammogram 02/23/2016    Negative    ??? Infertility     H/o   ??? Nausea & vomiting    ??? Pre-diabetes    ??? Screening for malignant neoplasm of the cervix 12/17/12    Negative ; HPV Negative   ??? Vitamin D deficiency         Past Surgical History:   Procedure Laterality Date   ??? HX CESAREAN SECTION      x 2   ??? HX HERNIA REPAIR  2014   ??? HX ORTHOPAEDIC      Shoulder     Social History     Occupational History   ??? Not on file.     Social History Main Topics   ??? Smoking status: Never Smoker   ??? Smokeless tobacco: Never Used      Comment: Never used vapor or e-cigs    ??? Alcohol use 0.5 oz/week     1 Glasses of wine per week   ??? Drug use: No   ??? Sexual activity: Yes     Partners: Male     Birth control/ protection: None      Comment: Vasectomy     Family History   Problem Relation Age of Onset   ??? Diabetes Father    ??? Heart Disease Maternal Grandmother    ??? Stroke Maternal Grandfather     ??? Breast Cancer Neg Hx        Allergies   Allergen Reactions   ??? Percodan [Oxycodone Hcl-Oxycodone-Asa] Itching     Prior to Admission medications    Medication Sig Start Date End Date Taking? Authorizing Provider   cholecalciferol, vitamin D3, (VITAMIN D3) 2,000 unit tab Take  by mouth.   Yes Historical Provider   ferrous sulfate (IRON) 325 mg (65 mg iron) tablet Take  by mouth Daily (before breakfast).   Yes Historical Provider   magnesium 250 mg tab Take  by mouth.   Yes Historical Provider   magnesium oxide (MAG-OX) 400 mg tablet Take 400 mg by mouth daily.   Yes Historical Provider   multivitamin, stress formula (STRESS TAB) tablet Take 1 Tab by mouth daily.   Yes Historical Provider  Objective:    Visit Vitals   ??? BP 118/67   ??? Pulse 66   ??? Ht 5\' 4"  (1.626 m)   ??? Wt 140 lb (63.5 kg)   ??? LMP 10/09/2016 (Exact Date)   ??? BMI 24.03 kg/m2          PHYSICAL EXAMINATION    Constitutional  ?? Appearance: well-nourished, well developed, alert, in no acute distress    HENT  ?? Head and Face: appears normal    Gastrointestinal  ?? Abdominal Examination: abdomen non-tender to palpation, no masses present  ?? Liver and spleen: no hepatomegaly present, spleen not palpable  ?? Hernias: no hernias identified    Genitourinary  ?? External Genitalia: normal appearance for age, no discharge present, no tenderness present, no inflammatory lesions present, no masses present, no atrophy present  ?? Vagina: normal vaginal vault without central or paravaginal defects, no discharge present, no inflammatory lesions present, no masses present  ?? Bladder: non-tender to palpation  ?? Urethra: appears normal  ?? Cervix: endocervical polyp extending through cervical os ~0.5cm diameter x 1.5cm long  ?? Uterus: right lateral fibroid ~4-5cm  ?? Adnexa: no adnexal tenderness present, no adnexal masses present  ?? Perineum: perineum within normal limits, no evidence of trauma, no rashes or skin lesions present   ?? Anus: anus within normal limits, no hemorrhoids present  ?? Inguinal Lymph Nodes: no lymphadenopathy present    Skin  ?? General Inspection: no rash, no lesions identified    Neurologic/Psychiatric  ?? Mental Status:  ?? Orientation: grossly oriented to person, place and time  ?? Mood and Affect: mood normal, affect appropriate    Assessment:   AUB  Endocervical polyp  ?irregular cycle    Plan:   Polyp removed (see separate note below)  Provera 10mg  x10d  RTO ~3wks for SIS.  Cytotec 479mcg day before and evening before procedure.  Menstrual calendar given.    Orders Placed This Encounter   ??? medroxyPROGESTERone (PROVERA) 10 mg tablet     Sig: Take 1 Tab by mouth daily for 10 days.     Dispense:  10 Tab     Refill:  0   ??? miSOPROStol (CYTOTEC) 200 mcg tablet     Sig: Take 2 tabs (435mcg) the day before and again the night before procedure)     Dispense:  4 Tab     Refill:  0             [facility]  OFFICE PROCEDURE PROGRESS NOTE        Chart reviewed for the following:   I, Carlisle Beers, MD, have reviewed the History, Physical and updated the Allergic reactions for [place] performed immediately prior to start of procedure:   I, Carlisle Beers, MD, have performed the following reviews on [place] J [place] prior to the start of the procedure:            * Patient was identified by name and date of birth   * Agreement on procedure being performed was verified  * Risks and Benefits explained to the patient  * Procedure site verified and marked as necessary  * Patient was positioned for comfort  * Consent was signed and verified     Time: 1540      Date of procedure: 11/05/2016    Procedure performed by:  Carlisle Beers, MD    Provider assisted by: Jerilynn Mages.  Gallardo MA    Patient assisted by: self    How tolerated by patient: tolerated the procedure well with no complications    Post Procedural Pain Scale: 0 - No Hurt    Comments: none      Procedure note: Cervical polyp removal    Indications:   AERON LHEUREUX is a 51 y.o. female [place] that was found to have a cervical polypoid lesion. After being presented with the risks, benefits and specific details of the procedure, she had no further questions.   Procedure: The patient was placed on the table in a dorsal lithotomy position. The cervix was prepped with Zephiran . Once cleansed, the cervical polyp was grasped and removed with traction and rotation. The specimen was placed in formalin and sent to pathology for evaluation.   Pressure was applied to the biopsy site to control bleeding. Hemostasis was adequate with direct pressure and silver nitrate.   The patient tolerated the procedure well. There were no complications.  She was observed for 10 minutes and was discharged in good condition.

## 2016-11-09 LAB — SURGICAL PATHOLOGY

## 2016-11-12 NOTE — Telephone Encounter (Signed)
Ok, I can advise her to stop the provera.  There is no sooner SIS appt slots before her scheduled date.  Advice?

## 2016-11-12 NOTE — Telephone Encounter (Addendum)
Patient was seen for abnormal uterine bleeding on 11/05/16 by DM.  She was given Provera 10 mg tablets to take for 10 days.  She started to bleed again after 5 days.  She wants to know should she continue or stop RX and should she reschedule her SIS (scheduled for 11/27/16)

## 2016-11-12 NOTE — Telephone Encounter (Signed)
Felicity Coyer spoke with her and moved her appt.

## 2016-11-12 NOTE — Telephone Encounter (Signed)
[  see mychart msg]  Stop progesterone  Move [place] appt up

## 2016-11-14 NOTE — Telephone Encounter (Signed)
:  Patient is having a SIS tomorrow afternoon and Mrava told her to take Cyotec differently than prescribed.        Per Dr. Jimmye Norman, okay to take two tabs today and two tomorrow am.    Patient has been advised and verbalized understanding.

## 2016-11-15 ENCOUNTER — Ambulatory Visit
Admit: 2016-11-15 | Discharge: 2016-11-15 | Payer: PRIVATE HEALTH INSURANCE | Attending: Obstetrics & Gynecology | Primary: Family Medicine

## 2016-11-15 ENCOUNTER — Encounter

## 2016-11-15 ENCOUNTER — Ambulatory Visit: Admit: 2016-11-15 | Discharge: 2016-11-15 | Payer: PRIVATE HEALTH INSURANCE | Primary: Family Medicine

## 2016-11-15 ENCOUNTER — Encounter: Admit: 2016-11-15 | Primary: Family Medicine

## 2016-11-15 DIAGNOSIS — N938 Other specified abnormal uterine and vaginal bleeding: Secondary | ICD-10-CM

## 2016-11-15 DIAGNOSIS — D250 Submucous leiomyoma of uterus: Secondary | ICD-10-CM

## 2016-11-15 NOTE — Telephone Encounter (Signed)
Patient called to report that she took her dosing of Cytotec last night to prepare for her SIS that is to be done this afternoon.  She started with severe hand palm itching, stiffness in hands, trouble swallowing, and hoarseness of voice.  She said that she called the doctor on call and and reported that this occurred about 30-40 minutes after taking the medication.  She said the issue lasted about 10 minutes and subsided.          Per Dr Marland Mcalpine, DO NOT TAKE THIS MEDICINE AGAIN.  She will try her best with SIS as is. Patient verbalized understanding.

## 2016-11-15 NOTE — Telephone Encounter (Signed)
Noted in chart of high severity allergy.

## 2016-11-15 NOTE — Progress Notes (Signed)
AUB.  Seen 11/05/16.  Endocervical polyp removed -> benign.  Bleeding a little better, but not resolved, then had period.  Returns today for SIS.  Stenotic os.  Was given RX for cytotec.  Took first dose of 478mcg last night as instructed -> had itching on palms.  Did not take second dose this AM.  Had some cramping after taking first dose.    A&P - AUB.  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  Also some irregularity post LUS, ?polyp  -> hysteroscopy with myomectomy and polypectomy with myosure  Risks and benefits reviewed including: bleeding; infection; uterine perforation with injury to surrounding structures including bowel, bladder, ureters, muscles, vessels, nerves, possibly requiring additional surgery to repair or remove; persistence or recurrence of symptoms; need for additional surgery or treatment depending on results.  Questions answered.          SONOHYSTEROGRAPHY    Laura Bradley is a G4 P2,  51 y.o. female [place] whose No LMP recorded.  was on . , presents for a sonohysterography.   The indications for this procedure were reviewed with the patient. The procedure was explained in detail and all questions were answered.   Procedure:  The patient was placed in the lithotomy position. A graves speculum was introduced into the vagina and the cervix was visualized. The cervix was prepped with zephiran solution. A Cook's Hysterography catheter could not be advanced beyond the internal os.  The anterior lip of the cervix was infiltrated with 3cc 1% lidocaine and grasped with a single tooth tenaculum.  A flexible green os finder was used to dilate the os.  The cather was then introduced into the uterine cavity and the speculum was removed. Sterile sonohysterography with 3D Reconstruction was performed. The endometrial cavity was distended with sterile saline.  Some backflow.  ~ 40cc used in total.  Good distension was achieved.   The findings are as follows: intracavitary fibroid arising from near fundus, ~2cm and post LUS irregularity, ?polyp  The patient tolerated the procedure well without complication, and was discharged to home.

## 2016-11-20 ENCOUNTER — Encounter

## 2016-11-23 NOTE — Addendum Note (Signed)
Addended by: Carlisle Beers on: 11/23/2016 12:38 PM      Modules accepted: Orders

## 2016-11-26 ENCOUNTER — Encounter: Attending: Obstetrics & Gynecology | Primary: Family Medicine

## 2016-11-26 ENCOUNTER — Encounter: Primary: Family Medicine

## 2016-11-27 ENCOUNTER — Encounter: Primary: Family Medicine

## 2016-11-27 ENCOUNTER — Encounter: Attending: Obstetrics & Gynecology | Primary: Family Medicine

## 2016-12-05 NOTE — Addendum Note (Signed)
Addended by: Carlisle Beers on: 12/05/2016 12:31 PM      Modules accepted: Orders

## 2016-12-10 ENCOUNTER — Encounter: Primary: Family Medicine

## 2016-12-17 ENCOUNTER — Encounter: Attending: Obstetrics & Gynecology | Primary: Family Medicine

## 2016-12-21 ENCOUNTER — Encounter

## 2016-12-28 NOTE — Addendum Note (Signed)
Addended by: Carlisle Beers on: 12/28/2016 12:44 PM      Modules accepted: Orders

## 2017-01-04 ENCOUNTER — Inpatient Hospital Stay: Admit: 2017-01-04 | Payer: BLUE CROSS/BLUE SHIELD | Primary: Family Medicine

## 2017-01-04 DIAGNOSIS — Z01810 Encounter for preprocedural cardiovascular examination: Secondary | ICD-10-CM

## 2017-01-04 LAB — TYPE AND SCREEN
ABO/Rh: B POS
Antibody Screen: NEGATIVE

## 2017-01-04 LAB — CBC W/O DIFF
ABSOLUTE NRBC: 0 10*3/uL (ref 0.00–0.01)
HCT: 38.7 % (ref 35.0–47.0)
HGB: 12.6 g/dL (ref 11.5–16.0)
MCH: 29.6 PG (ref 26.0–34.0)
MCHC: 32.6 g/dL (ref 30.0–36.5)
MCV: 91.1 FL (ref 80.0–99.0)
MPV: 11.4 FL (ref 8.9–12.9)
NRBC: 0 PER 100 WBC
PLATELET: 267 10*3/uL (ref 150–400)
RBC: 4.25 M/uL (ref 3.80–5.20)
RDW: 13.2 % (ref 11.5–14.5)
WBC: 7.8 10*3/uL (ref 3.6–11.0)

## 2017-01-04 LAB — TYPE & SCREEN
ABO/Rh(D): B POS
Antibody screen: NEGATIVE

## 2017-01-04 LAB — HCG QL SERUM: HCG, Ql.: NEGATIVE

## 2017-01-04 NOTE — Other (Addendum)
[facility] [place]  [address]  Pre-Admission Testing [phone]    Pre-Operative Instructions    Your procedure is scheduled for Friday, July 20th. We will call you the afternoon before surgery with your arrival time. If you have not received your arrival time by 7p.m., you may call us at [phone].    ? Please report to the 2nd Floor Admitting Desk on the day of your surgery. Bring your insurance card, photo identification, and applicable copayment.  ? If you are having same day surgery, you must have a responsible adult to drive you home and stay with you the first 24 hours after surgery.  ? You may not have anything to eat or drink after midnight the night before surgery.  ? With as little water as possible, take these medications the morning of surgery:  none  ? Do not drink alcoholic beverages 24 hours before and after your surgery.  ? Do not take Aspirin and/or any non-steroidal anti-inflammatory drugs beginning today, Friday July 13th, until after your surgery. This includes Ibuprofen, Motrin, Advil, Naproxen, & Aleve. You may resume post-operatively as directed by your surgeon.   ? You may take Tylenol for pain or discomfort.  ? See Medication Sheet for herbal supplement administration.  ? Wear comfortable clothes. Please leave all money, jewelry and valuables at home. No make-up, particularly mascara, the day of surgery. Remove all jewelry, including body piercings, and leave at home. Wear your hair loose or down, no pony-tails, buns, or any metal hair clips. If you shower the morning of surgery, please do not apply any lotions, powders, or deodorants afterwards. Do not shave any part of your body within 24 hours of surgery.  ? Should you become ill in the days before your surgery, even with a simple cold, please notify your surgeon???s office immediately.  ? Please follow all instructions to avoid any potential surgical cancellation.   ? If a situation occurs where you may be delayed the day of your surgery, please call us at [phone].   ? We offer free valet parking 7a.m.-5p.m.   ? Please bring the Medication Sheet with you on the day of your procedure.  ? The patient was contacted in person. She verbalized understanding of all instructions does not need reinforcement.

## 2017-01-05 LAB — EKG 12-LEAD
Atrial Rate: 54 {beats}/min
P Axis: 21 degrees
P-R Interval: 152 ms
Q-T Interval: 432 ms
QRS Duration: 90 ms
QTc Calculation (Bazett): 409 ms
R Axis: 60 degrees
T Axis: 61 degrees
Ventricular Rate: 54 {beats}/min

## 2017-01-05 LAB — EKG, 12 LEAD, INITIAL
Atrial Rate: 54 {beats}/min
Calculated P Axis: 21 degrees
Calculated R Axis: 60 degrees
Calculated T Axis: 61 degrees
P-R Interval: 152 ms
Q-T Interval: 432 ms
QRS Duration: 90 ms
QTC Calculation (Bezet): 409 ms
Ventricular Rate: 54 {beats}/min

## 2017-01-10 NOTE — H&P (Signed)
Gynecology History and Physical    Name: Laura Bradley MRN: [id number] SSN: [id number]    Date of Birth: [birth year]  Age: 51 y.o.  Sex: female       Subjective:      Chief complaint:  Abnormal uterine bleeding    [name] is a 51 y.o.  female with a history of abnormal uterine bleeding. Previous workup included a sonohysterogram which shows intracavitary fibroid arising near fundus, ~2cm.  Also some irregularity post LUS, ?polyp.  Also had endocervical polyp removed (benign).     Stenotic os.  Was given cytotec prior to SIS -> had itching on palms.    She is admitted for Procedure(s) (LRB):  HYSTEROSCOPY/DILATATION AND CURETTAGE/MYOMECTOMY AND POLYPECTOMY WITH MYOSURE (N/A).    The current method of family planning is vasectomy.    OB History     Gravida Para Term Preterm AB Living    4 2 2  2 2     SAB TAB Ectopic Molar Multiple Live Births      2   2        Past Medical History:   Diagnosis Date   ??? Anemia    ??? Ectopic pregnancy     H/o x 2 on right side   ??? Hx of mammogram 02/23/2016    Negative    ??? Infertility     H/o   ??? Nausea & vomiting    ??? Pre-diabetes    ??? Screening for malignant neoplasm of the cervix 12/17/12    Negative ; HPV Negative   ??? Vitamin D deficiency      Past Surgical History:   Procedure Laterality Date   ??? HX CESAREAN SECTION      x 2   ??? HX HERNIA REPAIR  2014   ??? HX ORTHOPAEDIC      Shoulder   ??? HX ORTHOPAEDIC Right 2014    tendon repair, 4th digit     Social History     Occupational History   ??? Not on file.     Social History Main Topics   ??? Smoking status: Never Smoker   ??? Smokeless tobacco: Never Used      Comment: Never used vapor or e-cigs    ??? Alcohol use Yes      Comment: occasional   ??? Drug use: No   ??? Sexual activity: Yes     Partners: Male     Birth control/ protection: None      Comment: Vasectomy     Family History   Problem Relation Age of Onset   ??? Diabetes Father    ??? Heart Disease Maternal Grandmother    ??? Stroke Maternal Grandfather    ??? Breast Cancer Neg Hx          Allergies   Allergen Reactions   ??? Cytotec [Misoprostol] Itching     Hoarse voice, trouble swallowing, stiff joints   ??? Percodan [Oxycodone Hcl-Oxycodone-Asa] Itching     Prior to Admission medications    Medication Sig Start Date End Date Taking? Authorizing Provider   OTHER 4 Caps. doTerra Micro Plex    Historical Provider   OTHER 4 Caps. doTerra Alpha CRS    Historical Provider   OTHER 4 Caps. doTerra xEO Mega    Historical Provider   OTHER 1 Cap. doTerra phytoestrogen    Historical Provider   OTHER 2 Caps. doTerra Bone nutrient    Historical Provider   OTHER 2 Caps. doTerra deep Blue  Historical Provider   OTHER 1 Cap. Haze Boyden    Historical Provider   OTHER 1 Cap. doTerra OnGuard    Historical Provider        Review of Systems:  A comprehensive review of systems was negative except for that written in the History of Present Illness.     Objective:     There were no vitals filed for this visit.    Physical Exam:  Patient without distress.  Heart: Regular rate and rhythm or S1S2 present  Lung: clear to auscultation throughout lung fields, no wheezes, no rales, no rhonchi and normal respiratory effort  Abdomen: soft, nontender  External Genitalia: normal general appearance  Urinary system: urethral meatus normal  Vagina: normal mucosa without prolapse or lesions  Cervix: normal appearance  Adnexa: normal bimanual exam  Uterus: right lateral fibroid     Assessment:     Abnormal uterine bleeding with suspected endometrial polyp and intracavitary myoma.    Plan:     Procedure(s) (LRB):  HYSTEROSCOPY/DILATATION AND CURETTAGE/MYOMECTOMY AND POLYPECTOMY WITH MYOSURE (N/A)  Discussed the risks of surgery including the risks of bleeding, infection, deep vein thrombosis, and surgical injuries to internal organs including but not limited to the bowel, bladder, ureters, rectum, female reproductive organs, blood vessels, muscles, nerves, possibly requiring  additional surgery to repair or remove. Additional evaluation and/or treatment may be necessary depending on final findings or pathology results.  Symptoms may persist or recur.  The patient understands the risks; any and all questions were answered to the patient's satisfaction.    Signed By:  Carlisle Beers, MD     January 10, 2017

## 2017-01-11 ENCOUNTER — Inpatient Hospital Stay: Payer: BLUE CROSS/BLUE SHIELD

## 2017-01-11 ENCOUNTER — Encounter
Admit: 2017-01-15 | Discharge: 2017-01-15 | Payer: PRIVATE HEALTH INSURANCE | Attending: Obstetrics & Gynecology | Primary: Family Medicine

## 2017-01-11 ENCOUNTER — Ambulatory Visit

## 2017-01-11 LAB — GLUCOSE, POC: Glucose (POC): 106 mg/dL — ABNORMAL HIGH (ref 65–100)

## 2017-01-11 LAB — HCG URINE, QL. - POC: Pregnancy test,urine (POC): NEGATIVE

## 2017-01-11 MED ORDER — LIDOCAINE (PF) 20 MG/ML (2 %) IJ SOLN
20 mg/mL (2 %) | INTRAMUSCULAR | Status: DC | PRN
Start: 2017-01-11 — End: 2017-01-11
  Administered 2017-01-11: 12:00:00 via INTRAVENOUS

## 2017-01-11 MED ORDER — HYDROCODONE-ACETAMINOPHEN 5 MG-325 MG TAB
5-325 mg | ORAL_TABLET | ORAL | 0 refills | Status: AC
Start: 2017-01-11 — End: ?

## 2017-01-11 MED ORDER — KETOROLAC TROMETHAMINE 30 MG/ML INJECTION
30 mg/mL (1 mL) | INTRAMUSCULAR | Status: DC | PRN
Start: 2017-01-11 — End: 2017-01-11
  Administered 2017-01-11: 12:00:00 via INTRAVENOUS

## 2017-01-11 MED ORDER — SILVER NITRATE APPLICATORS 75 %-25 % TOPICAL STICK
75-25 % | CUTANEOUS | Status: DC | PRN
Start: 2017-01-11 — End: 2017-01-11
  Administered 2017-01-11: 12:00:00 via TOPICAL

## 2017-01-11 MED ORDER — SODIUM CHLORIDE 0.9 % IJ SYRG
INTRAMUSCULAR | Status: DC | PRN
Start: 2017-01-11 — End: 2017-01-11

## 2017-01-11 MED ORDER — SILVER NITRATE APPLICATORS 75 %-25 % TOPICAL STICK
75-25 % | CUTANEOUS | Status: AC
Start: 2017-01-11 — End: ?

## 2017-01-11 MED ORDER — ONDANSETRON (PF) 4 MG/2 ML INJECTION
4 mg/2 mL | INTRAMUSCULAR | Status: AC
Start: 2017-01-11 — End: ?

## 2017-01-11 MED ORDER — PROPOFOL 10 MG/ML IV EMUL
10 mg/mL | INTRAVENOUS | Status: DC | PRN
Start: 2017-01-11 — End: 2017-01-11
  Administered 2017-01-11: 12:00:00 via INTRAVENOUS

## 2017-01-11 MED ORDER — ONDANSETRON (PF) 4 MG/2 ML INJECTION
4 mg/2 mL | INTRAMUSCULAR | Status: DC | PRN
Start: 2017-01-11 — End: 2017-01-11
  Administered 2017-01-11: 12:00:00 via INTRAVENOUS

## 2017-01-11 MED ORDER — DIPHENHYDRAMINE HCL 50 MG/ML IJ SOLN
50 mg/mL | INTRAMUSCULAR | Status: DC | PRN
Start: 2017-01-11 — End: 2017-01-11

## 2017-01-11 MED ORDER — FENTANYL CITRATE (PF) 50 MCG/ML IJ SOLN
50 mcg/mL | INTRAMUSCULAR | Status: DC | PRN
Start: 2017-01-11 — End: 2017-01-11
  Administered 2017-01-11 (×4): via INTRAVENOUS

## 2017-01-11 MED ORDER — SODIUM CHLORIDE 0.9 % IJ SYRG
Freq: Three times a day (TID) | INTRAMUSCULAR | Status: DC
Start: 2017-01-11 — End: 2017-01-11
  Administered 2017-01-11: 10:00:00 via INTRAVENOUS

## 2017-01-11 MED ORDER — KETOROLAC TROMETHAMINE 30 MG/ML INJECTION
30 mg/mL (1 mL) | INTRAMUSCULAR | Status: AC
Start: 2017-01-11 — End: ?

## 2017-01-11 MED ORDER — LIDOCAINE (PF) 10 MG/ML (1 %) IJ SOLN
10 mg/mL (1 %) | INTRAMUSCULAR | Status: DC | PRN
Start: 2017-01-11 — End: 2017-01-11

## 2017-01-11 MED ORDER — HYDROMORPHONE (PF) 2 MG/ML IJ SOLN
2 mg/mL | INTRAMUSCULAR | Status: DC | PRN
Start: 2017-01-11 — End: 2017-01-11
  Administered 2017-01-11: 13:00:00 via INTRAVENOUS

## 2017-01-11 MED ORDER — MIDAZOLAM 1 MG/ML IJ SOLN
1 mg/mL | INTRAMUSCULAR | Status: AC
Start: 2017-01-11 — End: ?

## 2017-01-11 MED ORDER — DEXAMETHASONE SODIUM PHOSPHATE 4 MG/ML IJ SOLN
4 mg/mL | INTRAMUSCULAR | Status: AC
Start: 2017-01-11 — End: ?

## 2017-01-11 MED ORDER — SCOPOLAMINE (1.3-1.5) MG 72 HR TRANSDERM PATCH
1 mg over 3 days | TRANSDERMAL | Status: DC
Start: 2017-01-11 — End: 2017-01-11

## 2017-01-11 MED ORDER — LACTATED RINGERS IV
INTRAVENOUS | Status: DC
Start: 2017-01-11 — End: 2017-01-11
  Administered 2017-01-11 (×2): via INTRAVENOUS

## 2017-01-11 MED ORDER — LACTATED RINGERS IV
INTRAVENOUS | Status: DC
Start: 2017-01-11 — End: 2017-01-11

## 2017-01-11 MED ORDER — FERRIC SUBSULFATE SOLN
20 to 22 gram/100 mL | Status: DC | PRN
Start: 2017-01-11 — End: 2017-01-11
  Administered 2017-01-11: 12:00:00 via TOPICAL

## 2017-01-11 MED ORDER — ONDANSETRON (PF) 4 MG/2 ML INJECTION
4 mg/2 mL | INTRAMUSCULAR | Status: DC | PRN
Start: 2017-01-11 — End: 2017-01-11

## 2017-01-11 MED ORDER — LIDOCAINE (PF) 20 MG/ML (2 %) IV SYRINGE
100 mg/5 mL (2 %) | INTRAVENOUS | Status: AC
Start: 2017-01-11 — End: ?

## 2017-01-11 MED ORDER — GLYCOPYRROLATE 0.2 MG/ML IJ SOLN
0.2 mg/mL | INTRAMUSCULAR | Status: AC
Start: 2017-01-11 — End: ?

## 2017-01-11 MED ORDER — GLYCOPYRROLATE 0.2 MG/ML IJ SOLN
0.2 mg/mL | INTRAMUSCULAR | Status: DC | PRN
Start: 2017-01-11 — End: 2017-01-11
  Administered 2017-01-11: 12:00:00 via INTRAVENOUS

## 2017-01-11 MED ORDER — PROPOFOL 10 MG/ML IV EMUL
10 mg/mL | INTRAVENOUS | Status: AC
Start: 2017-01-11 — End: ?

## 2017-01-11 MED ORDER — FENTANYL CITRATE (PF) 50 MCG/ML IJ SOLN
50 mcg/mL | INTRAMUSCULAR | Status: AC
Start: 2017-01-11 — End: ?

## 2017-01-11 MED ORDER — MIDAZOLAM 1 MG/ML IJ SOLN
1 mg/mL | INTRAMUSCULAR | Status: DC | PRN
Start: 2017-01-11 — End: 2017-01-11
  Administered 2017-01-11: 11:00:00 via INTRAVENOUS

## 2017-01-11 MED ORDER — WHITE PETROLATUM-MINERAL OIL 94 %-3 % EYE OINTMENT
94-3 % | OPHTHALMIC | Status: AC
Start: 2017-01-11 — End: ?

## 2017-01-11 MED ORDER — DEXAMETHASONE SODIUM PHOSPHATE 4 MG/ML IJ SOLN
4 mg/mL | INTRAMUSCULAR | Status: DC | PRN
Start: 2017-01-11 — End: 2017-01-11
  Administered 2017-01-11: 12:00:00 via INTRAVENOUS

## 2017-01-11 MED FILL — MIDAZOLAM 1 MG/ML IJ SOLN: 1 mg/mL | INTRAMUSCULAR | Qty: 2

## 2017-01-11 MED FILL — ONDANSETRON (PF) 4 MG/2 ML INJECTION: 4 mg/2 mL | INTRAMUSCULAR | Qty: 2

## 2017-01-11 MED FILL — BD POSIFLUSH NORMAL SALINE 0.9 % INJECTION SYRINGE: INTRAMUSCULAR | Qty: 10

## 2017-01-11 MED FILL — FENTANYL CITRATE (PF) 50 MCG/ML IJ SOLN: 50 mcg/mL | INTRAMUSCULAR | Qty: 2

## 2017-01-11 MED FILL — DIPRIVAN 10 MG/ML INTRAVENOUS EMULSION: 10 mg/mL | INTRAVENOUS | Qty: 20

## 2017-01-11 MED FILL — DEXAMETHASONE SODIUM PHOSPHATE 4 MG/ML IJ SOLN: 4 mg/mL | INTRAMUSCULAR | Qty: 2

## 2017-01-11 MED FILL — LACTATED RINGERS IV: INTRAVENOUS | Qty: 1000

## 2017-01-11 MED FILL — KETOROLAC TROMETHAMINE 30 MG/ML INJECTION: 30 mg/mL (1 mL) | INTRAMUSCULAR | Qty: 1

## 2017-01-11 MED FILL — LIDOCAINE (PF) 20 MG/ML (2 %) IV SYRINGE: 100 mg/5 mL (2 %) | INTRAVENOUS | Qty: 5

## 2017-01-11 MED FILL — SYSTANE NIGHTTIME 94 %-3 % EYE OINTMENT: 94-3 % | OPHTHALMIC | Qty: 3.5

## 2017-01-11 MED FILL — SILVER NITRATE APPLICATORS 75 %-25 % TOPICAL STICK: 75-25 % | CUTANEOUS | Qty: 1

## 2017-01-11 MED FILL — HYDROMORPHONE (PF) 2 MG/ML IJ SOLN: 2 mg/mL | INTRAMUSCULAR | Qty: 1

## 2017-01-11 MED FILL — GLYCOPYRROLATE 0.2 MG/ML IJ SOLN: 0.2 mg/mL | INTRAMUSCULAR | Qty: 1

## 2017-01-11 NOTE — Op Note (Signed)
Laura Bradley  OPERATIVE REPORT    Laura Bradley, Laura Bradley  MR#: [id number]  DOB: [birth year]  ACCOUNT #: [id number]   DATE OF SERVICE: 01/11/2017    PREOPERATIVE DIAGNOSIS:  Abnormal uterine bleeding with suspected submucosal fibroids and endometrial polyps.    PROCEDURES PERFORMED:  Hysteroscopy, dilatation and curettage, polypectomy and partial resection of submucosal fibroid with MyoSure.    POSTOPERATIVE DIAGNOSIS:  Abnormal uterine bleeding with suspected submucosal fibroids and endometrial polyps.    SURGEON:  Raechel Ache, MD    ANESTHESIA:  General.    ESTIMATED BLOOD LOSS:  Minimal.    SPECIMENS REMOVED:  Endometrial curettings with endometrial polyp, submucosal fibroid and possible endocervical polyp    HYSTEROSCOPY FLUID:  Normal saline with a final deficit of 1865 mL.    FINDINGS:  A somewhat stenotic os.  Markedly anteverted uterus.  A submucosal fibroid arising from the anterior uterine fundus.  An endometrial polyp at the right posterolateral wall as well as a small polyp at the left cornu.  There was also polypoid tissue from the right posterolateral wall near the junction of the lower uterine segment and endocervical canal.    COMPLICATIONS:  None.    DRAINS:  None.    COUNTS:  Correct.      ASSISTANT:  No assistants.    IMPLANTS:  No implants.    DISPOSITION:  To recovery room.    OPERATIVE INDICATIONS:  She reported abnormal uterine bleeding with persistent prolonged light bleeding.  In the office, an endocervical polyp was noted and removed, which did help significantly.  A saline infusion sonogram was performed to evaluate for any other etiologies.  The SIS demonstrated a probable submucosal fibroid as well as a suspected endometrial polyp.  She was taken to the OR for more definitive treatment.    DESCRIPTION:  Risks, benefits and alternatives were discussed with the patient and informed consent obtained.  The patient was identified in the  holding area and again in the operating room.  General anesthesia was established.  She was prepped and draped in the usual fashion including in and out catheterization of the bladder after being placed in modified lithotomy position in Laura Bradley.  A surgical timeout was performed.    An exam under anesthesia was performed, which demonstrated a small anteverted, anteflexed uterus.  The cervix was visualized with the speculum and noted to be fairly small.  The anterior lip of the cervix was grasped with a single tooth tenaculum.  The hysteroscope could not be introduced into the endocervical canal.    The external cervical os was easily dilated with Hegar dilators up to a #7 dilator.  The hysteroscope was then introduced into the endocervical canal and hydrostatic pressure used to facilitate some dilation.  However, the scope could not be advanced beyond the internal os.  The hysteroscope was removed and Hegar dilators then easily advanced to dilate the internal os up to a #8 Hegar.  The hysteroscope was then easily introduced into the uterine cavity.    The cavity was distended with normal saline and the above findings noted.  The MyoSure blade was introduced through the operative port.  The polyp from the right posterolateral wall and left cornu were resected.  Attention was then directed to the fibroid.  Because the uterus was quite anteflexed, the hysteroscope was rotated 180 degrees to permit being able to achieve a better angle, as the scope was bumping into the bottom of the bed otherwise.  The MyoSure was used to partially resect the fundal fibroid.  Because of the position of the fibroid, it was difficult to get a good purchase on the tissue.  The fluid deficit was approaching 1800.  Attention was then directed to the polypoid fragment that was near the lower uterine segment/endocervical canal.  This was easily resected.  The hysteroscope was advanced back into the uterine cavity and some additional  tissue resected from the fundal fibroid.  At this point, the fluid deficit was reading just over 2000, and so decision was made to terminate the procedure.  The hysteroscope was removed, as was the single tooth tenaculum.  Silver nitrate and Monsel's was applied to the tenaculum site with good hemostasis achieved.  At the completion of the procedure some additional fluid was noted in the drapes.  This was collected into the bag and a final fluid deficit of 1865 mL noted.  Patient tolerated the procedure well, was awakened in the operating room and went to recovery room in stable condition.      Carlisle Beers, MD       DMM / SN  D: 01/11/2017 08:55     T: 01/11/2017 11:24  JOB #: [id number]

## 2017-01-11 NOTE — Anesthesia Pre-Procedure Evaluation (Addendum)
Anesthetic History     PONV (only with C sections)          Review of Systems / Medical History  Patient summary reviewed, nursing notes reviewed and pertinent labs reviewed    Pulmonary  Within defined limits            Pertinent negatives: No recent URI     Neuro/Psych   Within defined limits           Cardiovascular  Within defined limits                Exercise tolerance: >4 METS     GI/Hepatic/Renal  Within defined limits              Endo/Other  Within defined limits           Other Findings            Physical Exam    Airway  Mallampati: I  TM Distance: > 6 cm  Neck ROM: normal range of motion   Mouth opening: Normal     Cardiovascular  Regular rate and rhythm,  S1 and S2 normal,  no murmur, click, rub, or gallop  Rhythm: regular  Rate: normal         Dental  No notable dental hx       Pulmonary  Breath sounds clear to auscultation               Abdominal  GI exam deferred       Other Findings            Anesthetic Plan    ASA: 2  Anesthesia type: general          Induction: Intravenous  Anesthetic plan and risks discussed with: Patient

## 2017-01-11 NOTE — Progress Notes (Signed)
Seen in office today

## 2017-01-11 NOTE — Progress Notes (Signed)
S- no new c/o  O - lungs CTAB        CV RRR  A&P - Pt seen and examined.  There has been no interval change from H&P. Patient is ready for surgery today as planned.

## 2017-01-11 NOTE — Other (Signed)
Fluid deficit 1865 ml

## 2017-01-11 NOTE — Brief Op Note (Signed)
BRIEF OPERATIVE NOTE    Date of Procedure: 01/11/2017   Preoperative Diagnosis: FIBROIDS/ENDOMETRIAL POLYP/ABNORMAL UTERINE BLEEDING  Postoperative Diagnosis: FIBROIDS/ENDOMETRIAL POLYP/ABNORMAL UTERINE BLEEDING    Procedure(s):  HYSTEROSCOPY/DILATATION AND CURETTAGE/MYOMECTOMY AND POLYPECTOMY WITH MYOSURE  Surgeon(s) and Role:     * Bryceson Grape Derrill Kay, MD - Primary         Surgical Assistant: n/a    Surgical Staff:  Circ-1: Donnie Coffin, RN  Scrub RN-1: Noel Journey  Event Time In   Incision Start [phone]   Incision Close [phone]     Anesthesia: General   Estimated Blood Loss: minimal.  Hysteroscopy fluid: NS with deficit=1865cc  Specimens:   ID Type Source Tests Collected by Time Destination   1 : emdometrial curetting polyp, fibroid, questioanable endocervical polyp Preservative Endometrial Curetting  Carlisle Beers, MD 01/11/2017 0825 Pathology      Findings: submucosal fibroid arising from the anterior fundus.  Three polyps noted: right posterolateral, left cornua, right posterolateral LUS/endocervical canal   Complications: none  Implants: * No implants in log *

## 2017-01-11 NOTE — Anesthesia Post-Procedure Evaluation (Signed)
Post-Anesthesia Evaluation and Assessment    Patient: Laura Bradley MRN: [id number]  SSN: [id number]    Date of Birth: 1966-06-04  Age: 51 y.o.  Sex: female       Cardiovascular Function/Vital Signs  Visit Vitals   ??? BP 146/71 (BP 1 Location: Right arm, BP Patient Position: At rest)   ??? Pulse 64   ??? Temp 36.6 ??C (97.8 ??F)   ??? Resp 15   ??? Ht 5\' 4"  (1.626 m)   ??? Wt 63.3 kg (139 lb 8.8 oz)   ??? SpO2 100%   ??? BMI 23.95 kg/m2       Patient is status post general anesthesia for Procedure(s):  HYSTEROSCOPY/DILATATION AND CURETTAGE/MYOMECTOMY AND POLYPECTOMY WITH MYOSURE.    Nausea/Vomiting: None    Postoperative hydration reviewed and adequate.    Pain:  Pain Scale 1: Numeric (0 - 10) (01/11/17 0933)  Pain Intensity 1: 2 (01/11/17 0933)   Managed    Neurological Status:   Neuro (WDL): Within Defined Limits (01/11/17 0933)  Neuro  Neurologic State: Drowsy (01/11/17 0840)   At baseline    Mental Status and Level of Consciousness: Arousable    Pulmonary Status:   O2 Device: Room air (01/11/17 0933)   Adequate oxygenation and airway patent    Complications related to anesthesia: None    Post-anesthesia assessment completed. No concerns    Signed By: Baltazar Najjar, MD     January 11, 2017

## 2017-01-11 NOTE — Op Note (Signed)
[facility]  OPERATIVE REPORT    Bradley Bradley TAMPLIN  MR#: [id number]  DOB: May 06, 1966  ACCOUNT #: [id number]   DATE OF SERVICE: 01/11/2017    PREOPERATIVE DIAGNOSIS:  Abnormal uterine bleeding with suspected submucosal fibroids and endometrial polyps.    PROCEDURES PERFORMED:  Hysteroscopy, dilatation and curettage, polypectomy and partial resection of submucosal fibroid with MyoSure.    POSTOPERATIVE DIAGNOSIS:  Abnormal uterine bleeding with suspected submucosal fibroids and endometrial polyps.    SURGEON:  Raechel Ache, MD    ANESTHESIA:  General.    ESTIMATED BLOOD LOSS:  Minimal.    SPECIMENS REMOVED:  Endometrial curettings with endometrial polyp, submucosal fibroid and possible endocervical polyp    HYSTEROSCOPY FLUID:  Normal saline with a final deficit of 1865 mL.    FINDINGS:  A somewhat stenotic os.  Markedly anteverted uterus.  A submucosal fibroid arising from the anterior uterine fundus.  An endometrial polyp at the right posterolateral wall as well as a small polyp at the left cornu.  There was also polypoid tissue from the right posterolateral wall near the junction of the lower uterine segment and endocervical canal.    COMPLICATIONS:  None.    DRAINS:  None.    COUNTS:  Correct.      ASSISTANT:  No assistants.    IMPLANTS:  No implants.    DISPOSITION:  To recovery room.    OPERATIVE INDICATIONS:  She reported abnormal uterine bleeding with persistent prolonged light bleeding.  In the office, an endocervical polyp was noted and removed, which did help significantly.  A saline infusion sonogram was performed to evaluate for any other etiologies.  The SIS demonstrated a probable submucosal fibroid as well as a suspected endometrial polyp.  She was taken to the OR for more definitive treatment.    DESCRIPTION:  Risks, benefits and alternatives were discussed with the patient and informed consent obtained.  The patient was identified in the holding area and again in the  operating room.  General anesthesia was established.  She was prepped and draped in the usual fashion including in and out catheterization of the bladder after being placed in modified lithotomy position in [place].  A surgical timeout was performed.    An exam under anesthesia was performed, which demonstrated a small anteverted, anteflexed uterus.  The cervix was visualized with the speculum and noted to be fairly small.  The anterior lip of the cervix was grasped with a single tooth tenaculum.  The hysteroscope could not be introduced into the endocervical canal.    The external cervical os was easily dilated with Hegar dilators up to a #7 dilator.  The hysteroscope was then introduced into the endocervical canal and hydrostatic pressure used to facilitate some dilation.  However, the scope could not be advanced beyond the internal os.  The hysteroscope was removed and Hegar dilators then easily advanced to dilate the internal os up to a #8 Hegar.  The hysteroscope was then easily introduced into the uterine cavity.    The cavity was distended with normal saline and the above findings noted.  The MyoSure blade was introduced through the operative port.  The polyp from the right posterolateral wall and left cornu were resected.  Attention was then directed to the fibroid.  Because the uterus was quite anteflexed, the hysteroscope was rotated 180 degrees to permit being able to achieve a better angle, as the scope was bumping into the bottom of the bed otherwise.  The MyoSure was used to partially resect the fundal fibroid.  Because of the position of the fibroid, it was difficult to get a good purchase on the tissue.  The fluid deficit was approaching 1800.  Attention was then directed to the polypoid fragment that was near the lower uterine segment/endocervical canal.  This was easily resected.  The hysteroscope was advanced back into the uterine cavity and some additional tissue resected from the fundal  fibroid.  At this point, the fluid deficit was reading just over 2000, and so decision was made to terminate the procedure.  The hysteroscope was removed, as was the single tooth tenaculum.  Silver nitrate and Monsel's was applied to the tenaculum site with good hemostasis achieved.  At the completion of the procedure some additional fluid was noted in the drapes.  This was collected into the bag and a final fluid deficit of 1865 mL noted.  Patient tolerated the procedure well, was awakened in the operating room and went to recovery room in stable condition.      Carlisle Beers, MD       DMM / SN  D: 01/11/2017 08:55     T: 01/11/2017 11:24  JOB #: [id number]

## 2017-01-17 MED FILL — PROPOFOL 10 MG/ML IV EMUL: 10 mg/mL | INTRAVENOUS | Qty: 170

## 2017-01-17 MED FILL — KETOROLAC TROMETHAMINE 30 MG/ML INJECTION: 30 mg/mL (1 mL) | INTRAMUSCULAR | Qty: 30

## 2017-01-17 MED FILL — GLYCOPYRROLATE 0.2 MG/ML IJ SOLN: 0.2 mg/mL | INTRAMUSCULAR | Qty: 0.2

## 2017-01-17 MED FILL — ONDANSETRON (PF) 4 MG/2 ML INJECTION: 4 mg/2 mL | INTRAMUSCULAR | Qty: 4

## 2017-01-17 MED FILL — LIDOCAINE (PF) 20 MG/ML (2 %) IJ SOLN: 20 mg/mL (2 %) | INTRAMUSCULAR | Qty: 80

## 2017-01-17 MED FILL — DEXAMETHASONE SODIUM PHOSPHATE 4 MG/ML IJ SOLN: 4 mg/mL | INTRAMUSCULAR | Qty: 8

## 2017-01-25 ENCOUNTER — Ambulatory Visit
Admit: 2017-01-25 | Discharge: 2017-01-25 | Payer: PRIVATE HEALTH INSURANCE | Attending: Obstetrics & Gynecology | Primary: Family Medicine

## 2017-01-25 DIAGNOSIS — Z09 Encounter for follow-up examination after completed treatment for conditions other than malignant neoplasm: Secondary | ICD-10-CM

## 2017-01-25 LAB — AMB POC PH, BODY FLUID EXCEPT BLOOD

## 2017-01-25 LAB — AMB POC SMEAR, STAIN & INTERPRET, WET MOUNT

## 2017-01-25 MED ORDER — AZITHROMYCIN 500 MG TAB
500 mg | ORAL_TABLET | ORAL | 0 refills | Status: AC
Start: 2017-01-25 — End: 2017-01-25

## 2017-01-25 NOTE — Progress Notes (Signed)
Postop Evaluation  Laura Bradley is a 51 y.o. female returns for a routine post-operative follow-up visit after undergoing the following: Hysteroscopy, dilatation and curettage, polypectomy and partial resection of submucosal fibroid with MyoSure, which was done on 01/11/17.  Her pathology results revealed:  FINAL PATHOLOGIC DIAGNOSIS   Endometrium, curettage:   Fragments of benign endometrial tissue with inactive endometrial glands.   Nodular fragments of myometrium compatible with submucosal leiomyoma.   Bleeding postop lasted about a week.   Having vaginal discharge, especially over last week or so, increasing.  No significant odor.  PHYSICAL EXAMINATION    Gastrointestinal  ?? Abdominal Examination: abdomen non-tender to palpation, no masses present  ?? Liver and spleen: no hepatomegaly present, spleen not palpable  ?? Hernias: no hernias identified    Genitourinary  ?? External Genitalia: normal appearance for age, no discharge present, no tenderness present, no inflammatory lesions present, no masses present, no atrophy present  ?? Vagina: normal vaginal vault without central or paravaginal defects, mod/lg amt yellow discharge present, no inflammatory lesions present, no masses present  ?? Bladder: non-tender to palpation  ?? Urethra: appears normal  ?? Cervix: normal   ?? Uterus: normal size, shape and consistency  ?? Adnexa: no adnexal tenderness present, no adnexal masses present  ?? Perineum: perineum within normal limits, no evidence of trauma, no rashes or skin lesions present  Skin  ?? General Inspection: no rash, no lesions identified    Neurologic/Psychiatric  ?? Mental Status:  ?? Orientation: grossly oriented to person, place and time  ?? Mood and Affect: mood normal, affect appropriate    Results for orders placed or performed in visit on 01/25/17   AMB POC PH, BODY FLUID EXCEPT BLOOD   Result Value Ref Range    pH,Body fluid (POC) 6.0-6.5     Source     AMB POC SMEAR, STAIN & INTERPRET, WET MOUNT    Result Value Ref Range    Wet mount (POC) +WBCs          Assessment:  2 wks S/P hysteroscopy/D&C/resection of submucosal fibroid. Also resected what appeared to be endometrial polyps, but final path report not read as polyps  Vaginal discharge      Plan:  [place] scheduled for Sept. Can reassess menstrual bleeding at that visit.  Nus[facility]ab for BV/yeast    Orders Placed This Encounter   ??? NUSWAB VAGINOSIS + CANDIDA   ??? AMB POC PH, BODY FLUID EXCEPT BLOOD   ??? AMB POC SMEAR, STAIN & INTERPRET, WET MOUNT   ??? azithromycin (ZITHROMAX) 500 mg tab     Sig: Take 2 Tabs by mouth now for 1 dose.     Dispense:  2 Tab     Refill:  0

## 2017-01-25 NOTE — Patient Instructions (Signed)
MyChart Help Desk: [phone]       Kegel Exercises: Care Instructions  Your Care Instructions    Kegel exercises strengthen muscles around the bladder. These muscles control the flow of urine. Kegel exercises are sometime called "pelvic floor" exercises. They can help prevent urine leakage and keep the pelvic organs in place.  A woman who just had a baby might want to try Kegel exercises. They can strengthen pelvic muscles that have been weakened by pregnancy and childbirth. A man or woman may use Kegel exercises to treat urine leakage.  You do Kegel exercises by tightening the muscles you use when you urinate. You will likely need to do these exercises for several weeks to get better.  Follow-up care is a key part of your treatment and safety. Be sure to make and go to all appointments, and call your doctor if you are having problems. It's also a good idea to know your test results and keep a list of the medicines you take.  How can you care for yourself at home?  ?? Do Kegel exercises.  ?? Find the muscles you need to strengthen. To do this, tighten the muscles that stop your urine while you are going to the bathroom. These are the same muscles you squeeze during Kegel exercises.  ?? Squeeze the muscles as hard as you can. Your belly and thighs should not move.  ?? Hold the squeeze for 3 seconds. Then relax for 3 seconds.  ?? Start with 3 seconds, and then add 1 second each week until you are able to squeeze for 10 seconds.  ?? Repeat the exercise 10 to 15 times for each session. Do three or more sessions each day.  ?? You can check to see if you are using the right muscles. Place a finger in your vagina and squeeze around it. You are doing them right when you feel pressure around your finger. Your doctor may also suggest that you put special weights in your vagina while you do the exercises.  ?? Do not smoke. It can irritate the bladder. If you need help quitting,  talk to your doctor about stop-smoking programs and medicines. These can increase your chances of quitting for good.  Where can you learn more?  Go to http://www.healthwise.net/[employer].  Enter Q681 in the search box to learn more about "Kegel Exercises: Care Instructions."  Current as of: Nov 04, 2015  Content Version: 11.7  ?? 2006-2018 [employer]. Care instructions adapted under license by [employer] (which disclaims liability or warranty for this information). If you have questions about a medical condition or this instruction, always ask your healthcare professional. [employer] disclaims any warranty or liability for your use of this information.

## 2017-02-01 LAB — NUSWAB VAGINOSIS + CANDIDA
C. albicans, NAA: NEGATIVE
C. glabrata, NAA: NEGATIVE

## 2017-03-22 ENCOUNTER — Ambulatory Visit
Admit: 2017-03-22 | Discharge: 2017-03-22 | Payer: PRIVATE HEALTH INSURANCE | Attending: Obstetrics & Gynecology | Primary: Family Medicine

## 2017-03-22 ENCOUNTER — Encounter: Admit: 2017-03-22 | Discharge: 2017-03-22 | Payer: PRIVATE HEALTH INSURANCE | Primary: Family Medicine

## 2017-03-22 ENCOUNTER — Encounter

## 2017-03-22 ENCOUNTER — Encounter: Admit: 2017-03-22 | Primary: Family Medicine

## 2017-03-22 DIAGNOSIS — Z01419 Encounter for gynecological examination (general) (routine) without abnormal findings: Secondary | ICD-10-CM

## 2017-03-22 DIAGNOSIS — Z1231 Encounter for screening mammogram for malignant neoplasm of breast: Secondary | ICD-10-CM

## 2017-03-22 NOTE — Progress Notes (Signed)
Annual exam ages 18-64    [place]stone is a G4 P2022,  51 y.o. female Laura Bradley Patient's last menstrual period was 02/27/2017 (exact date)., who presents for her annual checkup.     Since her last annual GYN exam about one year ago on 02/13/16,  she has the following changes in her health history:   Hysteroscopy/D&C for polyp and fibroid in July.    ADDITIONAL CONCERNS:  She is having no significant problems.  Dysmenorrhea better since she has been using essential oils as well as supplements.  Started having some hot flashes.  Added phytoestrogens which has been helpful.    With regard to the Gardasil vaccine, she is older than the age for which it is FDA approved.    Menstrual status:    Her periods are moderate in flow. She is using three to five pads or tampons per day, usually regular and last 26-30 days.    She has some dysmenorrhea.    She denies premenstrual symptoms.    Contraception:    The current method of family planning is vasectomy.    Sexual history:     reports that she currently engages in sexual activity and has had female partners. She reports using the following method of birth control/protection: vasectomy        Pap and Mammogram History:    Her most recent Pap smear was normal, HPV was negative, obtained on 12/17/12.    The patient had her mammogram today in our office.    Osteoporosis History:    Family history does not include a first or second degree relative with osteopenia or osteoporosis.      Past Medical History:   Diagnosis Date   ??? Anemia    ??? Ectopic pregnancy     H/o x 2 on right side   ??? Hx of mammogram 02/23/2016    Negative    ??? Infertility     H/o   ??? Nausea & vomiting    ??? Pre-diabetes    ??? Screening for malignant neoplasm of the cervix 12/17/12    Negative ; HPV Negative   ??? Vitamin D deficiency      Past Surgical History:   Procedure Laterality Date   ??? HX CESAREAN SECTION      x 2   ??? HX HERNIA REPAIR  2014   ??? HX HYSTERECTOMY  01/11/2017     Hysteroscopy, dilatation and curettage, polypectomy and partial resection of submucosal fibroid with MyoSure   ??? HX ORTHOPAEDIC      Shoulder   ??? HX ORTHOPAEDIC Right 2014    tendon repair, 4th digit     OB History   Gravida Para Term Preterm AB Living   4 2 2  2 2    SAB TAB Ectopic Molar Multiple Live Births     2   2      # Outcome Date GA Lbr Len/2nd Weight Sex Delivery Anes PTL Lv   4 Term 03/09/99 [redacted]w[redacted]d  8 lb 3 oz (3.714 kg) M C-SECTION CL Spinal N LIV      Birth Comments: Breech   3 Term 11/02/95 [redacted]w[redacted]d  9 lb 9 oz (4.338 kg) F C-SECTION CL Spinal N LIV      Birth Comments: Breech   2 Ectopic            1 Ectopic  Current Outpatient Prescriptions   Medication Sig Dispense Refill   ??? OTHER 4 Caps. doTerra Micro Plex     ??? OTHER 4 Caps. doTerra Alpha CRS     ??? OTHER 4 Caps. doTerra xEO Mega     ??? OTHER 1 Cap. doTerra phytoestrogen     ??? OTHER 2 Caps. doTerra Bone nutrient     ??? OTHER 2 Caps. doTerra deep Blue     ??? OTHER 1 Cap. doTerra TriEase     ??? OTHER 1 Cap. doTerra OnGuard     ??? HYDROcodone-acetaminophen (NORCO) 5-325 mg per tablet 1-2 tabs PO every 4-6 hours as needed for pain. 5 Tab 0     Allergies: Cytotec [misoprostol] and Percodan [oxycodone hcl-oxycodone-asa]   Social History     Social History   ??? Marital status: MARRIED     Spouse name: N/A   ??? Number of children: N/A   ??? Years of education: N/A     Occupational History   ??? Not on file.     Social History Main Topics   ??? Smoking status: Never Smoker   ??? Smokeless tobacco: Never Used      Comment: Never used vapor or e-cigs    ??? Alcohol use Yes      Comment: occasional   ??? Drug use: No   ??? Sexual activity: Yes     Partners: Male     Birth control/ protection: None      Comment: Vasectomy     Other Topics Concern   ??? Not on file     Social History Narrative     Tobacco History:  reports that she has never smoked. She has never used smokeless tobacco.  Alcohol Abuse:  reports that she drinks alcohol.   Drug Abuse:  reports that she does not use illicit drugs.    There is no problem list on file for this patient.    Family History   Problem Relation Age of Onset   ??? Diabetes Father    ??? Heart Disease Maternal Grandmother    ??? Stroke Maternal Grandfather    ??? Breast Cancer Neg Hx        Review of Systems - History obtained from the patient  Constitutional: negative for weight loss, fever, night sweats  HEENT: negative for hearing loss, earache, congestion, snoring, sorethroat  CV: negative for chest pain, palpitations, edema  Resp: negative for cough, shortness of breath, wheezing  GI: negative for change in bowel habits, abdominal pain, black or bloody stools  GU: negative for frequency, dysuria, hematuria, vaginal discharge  MSK: negative for back pain, joint pain, muscle pain  Breast: negative for breast lumps, nipple discharge, galactorrhea  Skin :negative for itching, rash, hives  Neuro: negative for dizziness, headache, confusion, weakness  Psych: negative for anxiety, depression, change in mood  Heme/lymph: negative for bleeding, bruising, pallor    Physical Exam    Visit Vitals   ??? BP 131/70   ??? Pulse 61   ??? Ht 5\' 4"  (1.626 m)   ??? Wt 142 lb (64.4 kg)   ??? LMP 02/27/2017 (Exact Date)   ??? BMI 24.37 kg/m2       Constitutional  ?? Appearance: well-nourished, well developed, alert, in no acute distress    HENT  ?? Head and Face: appears normal    Neck  ?? Inspection/Palpation: normal appearance, no masses or tenderness  ?? Lymph Nodes: no lymphadenopathy present  ?? Thyroid: gland size normal, nontender,  no nodules or masses present on palpation    Chest  ?? Respiratory Effort: breathing unlabored  ?? Auscultation: normal breath sounds    Cardiovascular  ?? Heart:  ?? Auscultation: regular rate and rhythm without murmur    Breasts  ?? Inspection of Breasts: breasts symmetrical, no skin changes, no discharge present, nipple appearance normal, no skin retraction present   ?? Palpation of Breasts and Axillae: no masses present on palpation, no breast tenderness  ?? Axillary Lymph Nodes: no lymphadenopathy present    Gastrointestinal  ?? Abdominal Examination: abdomen non-tender to palpation, normal bowel sounds, no masses present  ?? Liver and spleen: no hepatomegaly present, spleen not palpable  ?? Hernias: no hernias identified    Genitourinary  ?? External Genitalia: normal appearance for age, no discharge present, no tenderness present, no inflammatory lesions present, no masses present, no atrophy present  ?? Vagina: normal vaginal vault without central or paravaginal defects, no discharge present, no inflammatory lesions present, no masses present  ?? Bladder: non-tender to palpation  ?? Urethra: appears normal  ?? Cervix: normal   ?? Uterus: normal size, shape and consistency  ?? Adnexa: no adnexal tenderness present, no adnexal masses present  ?? Perineum: perineum within normal limits, no evidence of trauma, no rashes or skin lesions present  ?? Anus: anus within normal limits, no hemorrhoids present  ?? Inguinal Lymph Nodes: no lymphadenopathy present    Skin  ?? General Inspection: no rash, no lesions identified    Neurologic/Psychiatric  ?? Mental Status:  ?? Orientation: grossly oriented to person, place and time  ?? Mood and Affect: mood normal, affect appropriate        Assessment & Plan:  ?? Routine gynecologic examination.  Pap/HPV done.  ?? Her current medical status is satisfactory with no evidence of significant gynecologic issues.  ?? Dysmenorrhea improved.  ?? Counseled re: diet, exercise, healthy lifestyle  ?? Return for yearly wellness visits  ?? Rec annual mammogram.  Done today in our office.  ?? Patient verbalized understanding           Orders Placed This Encounter   ??? PAP IG, HPV AND RFX HPV [phone])     Order Specific Question:   Pap Source?     Answer:   Cervical and Endocervical     Order Specific Question:   Total Hysterectomy?     Answer:   No      Order Specific Question:   Supracervical Hysterectomy?     Answer:   No     Order Specific Question:   Post Menopausal?     Answer:   No     Order Specific Question:   Hormone Therapy?     Answer:   No     Order Specific Question:   IUD?     Answer:   No     Order Specific Question:   Abnormal Bleeding?     Answer:   No     Order Specific Question:   Pregnant     Answer:   No     Order Specific Question:   Post Partum?     Answer:   No

## 2017-03-22 NOTE — Patient Instructions (Signed)
Well Visit, Women 50 to 65: Care Instructions  Your Care Instructions    Physical exams can help you stay healthy. Your doctor has checked your overall health and may have suggested ways to take good care of yourself. He or she also may have recommended tests. At home, you can help prevent illness with healthy eating, regular exercise, and other steps.  Follow-up care is a key part of your treatment and safety. Be sure to make and go to all appointments, and call your doctor if you are having problems. It's also a good idea to know your test results and keep a list of the medicines you take.  How can you care for yourself at home?  ?? Reach and stay at a healthy weight. This will lower your risk for many problems, such as obesity, diabetes, heart disease, and high blood pressure.  ?? Get at least 30 minutes of exercise on most days of the week. Walking is a good choice. You also may want to do other activities, such as running, swimming, cycling, or playing tennis or team sports.  ?? Do not smoke. Smoking can make health problems worse. If you need help quitting, talk to your doctor about stop-smoking programs and medicines. These can increase your chances of quitting for good.  ?? Protect your skin from too much sun. When you're outdoors from 10 a.m. to 4 p.m., stay in the shade or cover up with clothing and a hat with a wide brim. Wear sunglasses that block UV rays. Even when it's cloudy, put broad-spectrum sunscreen (SPF 30 or higher) on any exposed skin.  ?? See a dentist one or two times a year for checkups and to have your teeth cleaned.  ?? Wear a seat belt in the car.  ?? Limit alcohol to 1 drink a day. Too much alcohol can cause health problems.  Follow your doctor's advice about when to have certain tests. These tests can spot problems early.  ?? Cholesterol. Your doctor will tell you how often to have this done based on your age, family history, or other things that can increase your risk  for heart attack and stroke.  ?? Blood pressure. Have your blood pressure checked during a routine doctor visit. Your doctor will tell you how often to check your blood pressure based on your age, your blood pressure results, and other factors.  ?? Mammogram. Ask your doctor how often you should have a mammogram, which is an X-ray of your breasts. A mammogram can spot breast cancer before it can be felt and when it is easiest to treat.  ?? Pap test and pelvic exam. Ask your doctor how often you should have a Pap test. You may not need to have a Pap test as often as you used to.  ?? Vision. Have your eyes checked every year or two or as often as your doctor suggests. Some experts recommend that you have yearly exams for glaucoma and other age-related eye problems starting at age 50.  ?? Hearing. Tell your doctor if you notice any change in your hearing. You can have tests to find out how well you hear.  ?? Diabetes. Ask your doctor whether you should have tests for diabetes.  ?? Colon cancer. You should begin tests for colon cancer at age 50. You may have one of several tests. Your doctor will tell you how often to have tests based on your age and risk. Risks include whether you already had a precancerous polyp   removed from your colon or whether your parents, sisters and brothers, or children have had colon cancer.  ?? Thyroid disease. Talk to your doctor about whether to have your thyroid checked as part of a regular physical exam. Women have an increased chance of a thyroid problem.  ?? Osteoporosis. You should begin tests for bone density at age 65. If you are younger than 65, ask your doctor whether you have factors that may increase your risk for this disease. You may want to have this test before age 65.  ?? Heart attack and stroke risk. At least every 4 to 6 years, you should have your risk for heart attack and stroke assessed. Your doctor uses factors such as your age, blood pressure, cholesterol, and whether you  smoke or have diabetes to show what your risk for a heart attack or stroke is over the next 10 years.  When should you call for help?  Watch closely for changes in your health, and be sure to contact your doctor if you have any problems or symptoms that concern you.  Where can you learn more?  Go to http://www.healthwise.net/[employer].  Enter Y074 in the search box to learn more about "Well Visit, Women 50 to 65: Care Instructions."  Current as of: Nov 08, 2015  Content Version: 11.7  ?? 2006-2018 [employer]. Care instructions adapted under license by [employer] (which disclaims liability or warranty for this information). If you have questions about a medical condition or this instruction, always ask your healthcare professional. Healthwise, Inc[employer] disclaims any warranty or liability for your use of this information.

## 2017-03-25 ENCOUNTER — Telehealth

## 2017-03-25 NOTE — Progress Notes (Signed)
Added and updated health maintenance   - pt will call to sch.

## 2017-03-25 NOTE — Progress Notes (Signed)
Patient notified and verbalized understanding    Sent her a Pharmacist, community message with [facility] scheduling number.

## 2017-03-25 NOTE — Telephone Encounter (Signed)
-----   Message from Carlisle Beers, MD sent at 03/25/2017  4:41 PM EDT -----  Needs additional views.  Please add to FS, notify, tickle.

## 2017-03-25 NOTE — Telephone Encounter (Signed)
Patient notified and verbalized understanding    Sent her a Pharmacist, community message with [facility] scheduling number.

## 2017-03-25 NOTE — Telephone Encounter (Signed)
Add't views generated

## 2017-03-27 ENCOUNTER — Telehealth

## 2017-03-27 LAB — PAP IG, HPV AND RFX HPV 16/18,45(507815)
.: 0
HPV DNA Probe, High Risk: NEGATIVE
HPV, High Risk: NEGATIVE
LABCORP 019018: 0

## 2017-03-27 NOTE — Telephone Encounter (Signed)
Elmo Putt from Coordination of care calling stating that the mammogram order that was placed on 03/25/2017 cannot be used since it states same day and needs to be updated.  New order placed.

## 2017-04-02 ENCOUNTER — Inpatient Hospital Stay: Payer: BLUE CROSS/BLUE SHIELD | Attending: Obstetrics & Gynecology | Primary: Family Medicine

## 2017-04-02 ENCOUNTER — Inpatient Hospital Stay: Admit: 2017-04-02 | Payer: BLUE CROSS/BLUE SHIELD | Attending: Obstetrics & Gynecology | Primary: Family Medicine

## 2017-04-02 ENCOUNTER — Encounter

## 2017-04-02 ENCOUNTER — Ambulatory Visit

## 2017-04-02 DIAGNOSIS — R928 Other abnormal and inconclusive findings on diagnostic imaging of breast: Secondary | ICD-10-CM

## 2017-04-02 NOTE — Progress Notes (Signed)
Add't views done today, 04/02/17;  IMPRESSION: BI-RADS Assessment Category 2: Benign finding. 3.2 cm benign simple  cyst at the 11:00 position of the left breast corresponds to the mammographic  finding. There is no sonographic evidence of malignancy. Annual screening  mammography is recommended. Findings and recommendations were discussed with the  patient.

## 2017-04-04 NOTE — Progress Notes (Signed)
Tickled

## 2018-01-13 DIAGNOSIS — M542 Cervicalgia: Secondary | ICD-10-CM | POA: Diagnosis not present

## 2018-01-13 DIAGNOSIS — M47892 Other spondylosis, cervical region: Secondary | ICD-10-CM | POA: Diagnosis not present

## 2018-01-17 DIAGNOSIS — M542 Cervicalgia: Secondary | ICD-10-CM | POA: Diagnosis not present

## 2018-01-17 DIAGNOSIS — M47892 Other spondylosis, cervical region: Secondary | ICD-10-CM | POA: Diagnosis not present

## 2018-01-22 DIAGNOSIS — M47892 Other spondylosis, cervical region: Secondary | ICD-10-CM | POA: Diagnosis not present

## 2018-01-22 DIAGNOSIS — M542 Cervicalgia: Secondary | ICD-10-CM | POA: Diagnosis not present

## 2018-01-24 DIAGNOSIS — M542 Cervicalgia: Secondary | ICD-10-CM | POA: Diagnosis not present

## 2018-01-24 DIAGNOSIS — M47892 Other spondylosis, cervical region: Secondary | ICD-10-CM | POA: Diagnosis not present

## 2018-01-27 DIAGNOSIS — M47892 Other spondylosis, cervical region: Secondary | ICD-10-CM | POA: Diagnosis not present

## 2018-01-27 DIAGNOSIS — M542 Cervicalgia: Secondary | ICD-10-CM | POA: Diagnosis not present

## 2018-01-29 DIAGNOSIS — M47892 Other spondylosis, cervical region: Secondary | ICD-10-CM | POA: Diagnosis not present

## 2018-01-29 DIAGNOSIS — M542 Cervicalgia: Secondary | ICD-10-CM | POA: Diagnosis not present

## 2018-02-05 DIAGNOSIS — M542 Cervicalgia: Secondary | ICD-10-CM | POA: Diagnosis not present

## 2018-02-05 DIAGNOSIS — M47892 Other spondylosis, cervical region: Secondary | ICD-10-CM | POA: Diagnosis not present

## 2018-02-12 DIAGNOSIS — M542 Cervicalgia: Secondary | ICD-10-CM | POA: Diagnosis not present

## 2018-02-12 DIAGNOSIS — M47892 Other spondylosis, cervical region: Secondary | ICD-10-CM | POA: Diagnosis not present

## 2018-05-14 NOTE — Telephone Encounter (Signed)
Call received at 14:67PM    52 year old patient last seen in the office on 03/22/17 .   Patient calling to say that she sent a my chart message but is not able to see the response.    This nurse advised of MD response through my chart that patient could not read.    Patient verbalized understanding.

## 2018-05-27 ENCOUNTER — Other Ambulatory Visit: Payer: Self-pay | Admitting: Radiology

## 2018-05-27 DIAGNOSIS — N6320 Unspecified lump in the left breast, unspecified quadrant: Secondary | ICD-10-CM | POA: Diagnosis not present

## 2018-06-11 ENCOUNTER — Ambulatory Visit
Admission: RE | Admit: 2018-06-11 | Discharge: 2018-06-11 | Disposition: A | Discharge status: Home / Self Care | Payer: Federal, State, Local not specified - PPO | Source: Ambulatory Visit | Attending: Radiology | Admitting: Radiology

## 2018-06-11 DIAGNOSIS — N6001 Solitary cyst of right breast: Secondary | ICD-10-CM | POA: Diagnosis not present

## 2018-06-11 DIAGNOSIS — N6320 Unspecified lump in the left breast, unspecified quadrant: Secondary | ICD-10-CM

## 2018-06-11 DIAGNOSIS — R922 Inconclusive mammogram: Secondary | ICD-10-CM | POA: Diagnosis not present

## 2019-01-14 ENCOUNTER — Encounter: Payer: Self-pay | Admitting: Certified Nurse Midwife

## 2019-01-19 DIAGNOSIS — M542 Cervicalgia: Secondary | ICD-10-CM | POA: Diagnosis not present

## 2019-01-19 DIAGNOSIS — M4692 Unspecified inflammatory spondylopathy, cervical region: Secondary | ICD-10-CM | POA: Diagnosis not present

## 2019-01-22 DIAGNOSIS — M47892 Other spondylosis, cervical region: Secondary | ICD-10-CM | POA: Diagnosis not present

## 2019-01-28 DIAGNOSIS — M47892 Other spondylosis, cervical region: Secondary | ICD-10-CM | POA: Diagnosis not present

## 2019-01-30 ENCOUNTER — Other Ambulatory Visit: Payer: Self-pay

## 2019-01-30 DIAGNOSIS — M47892 Other spondylosis, cervical region: Secondary | ICD-10-CM | POA: Diagnosis not present

## 2019-02-02 DIAGNOSIS — M47892 Other spondylosis, cervical region: Secondary | ICD-10-CM | POA: Diagnosis not present

## 2019-02-03 ENCOUNTER — Other Ambulatory Visit (HOSPITAL_COMMUNITY)
Admission: RE | Admit: 2019-02-03 | Discharge: 2019-02-03 | Disposition: A | Discharge status: Home / Self Care | Payer: Federal, State, Local not specified - PPO | Source: Ambulatory Visit | Attending: Certified Nurse Midwife | Admitting: Certified Nurse Midwife

## 2019-02-03 ENCOUNTER — Ambulatory Visit: Payer: Federal, State, Local not specified - PPO | Admitting: Certified Nurse Midwife

## 2019-02-03 ENCOUNTER — Other Ambulatory Visit: Payer: Self-pay

## 2019-02-03 ENCOUNTER — Encounter: Payer: Self-pay | Admitting: Certified Nurse Midwife

## 2019-02-03 VITALS — BP 116/64 | HR 68 | Temp 97.7°F | Resp 16 | Ht 63.75 in | Wt 144.0 lb

## 2019-02-03 DIAGNOSIS — Z01419 Encounter for gynecological examination (general) (routine) without abnormal findings: Secondary | ICD-10-CM

## 2019-02-03 DIAGNOSIS — N912 Amenorrhea, unspecified: Secondary | ICD-10-CM

## 2019-02-03 DIAGNOSIS — Z124 Encounter for screening for malignant neoplasm of cervix: Secondary | ICD-10-CM | POA: Insufficient documentation

## 2019-02-03 DIAGNOSIS — E559 Vitamin D deficiency, unspecified: Secondary | ICD-10-CM

## 2019-02-03 DIAGNOSIS — N951 Menopausal and female climacteric states: Secondary | ICD-10-CM | POA: Diagnosis not present

## 2019-02-03 NOTE — Patient Instructions (Signed)

## 2019-02-03 NOTE — Progress Notes (Signed)
53 y.o. [id number] Married  Caucasian Fe here to establish gyn care and for annual exam. LMP 12/23/2018 early July had slight pink discharge again. ? Menopausal. It has  been 4-6 months since last pink discharge. Last real period 04/2018. Slight vaginal dryness. Occasional hot flash, no night sweats. History of fibroid surgery, unable to remove with  D and C surgery. No other Gyn concerns.Has Primary care visit scheduled in 02/2019 for labs and exam. Plans flu vaccine and interested in Shingles vaccine.   No LMP recorded. (Menstrual status: Other).          Sexually active: Yes.    The current method of family planning is vasectomy.    Exercising: Yes.    walking Smoker:  no  Review of Systems  Constitutional: Negative.   HENT: Negative.   Eyes: Negative.   Respiratory: Negative.   Cardiovascular: Negative.   Gastrointestinal: Negative.   Genitourinary: Negative.        Pain with intercourse, irregular cycle  Musculoskeletal: Negative.   Skin: Negative.   Neurological: Negative.   Endo/Heme/Allergies: Negative.   Psychiatric/Behavioral: Negative.     Health Maintenance: Pap:  26yrs ago neg per patient History of Abnormal Pap: no MMG: 06/11/18 [place] left BIRADS2:benign. F/u 1 year Self Breast exams: occ Colonoscopy:  Age 45 f/u 12yrs not done BMD:   none TDaP:  unsure Shingles: not done Pneumonia: not done Hep C and HIV: not done Labs: if needed   reports that she has never smoked. She has never used smokeless tobacco. She reports current alcohol use. She reports that she does not use drugs.  Past Medical History:  Diagnosis Date  . Anemia    as a child  . Fibroid   . Infertility, female    took clomid    Past Surgical History:  Procedure Laterality Date  . CESAREAN SECTION     times 2  . DILATION AND CURETTAGE OF UTERUS    . HAND TENDON SURGERY     right pinky  . HERNIA REPAIR    . SHOULDER SURGERY Right     Current Outpatient Medications  Medication Sig Dispense  Refill  . meloxicam (MOBIC) 15 MG tablet      No current facility-administered medications for this visit.     Family History  Problem Relation Age of Onset  . Heart disease Father   . Cancer Maternal Grandmother   . Hypertension Paternal Grandfather   . Heart attack Paternal Grandfather     ROS:  Pertinent items are noted in HPI.  Otherwise, a comprehensive ROS was negative.  Exam:   BP 116/64   Pulse 68   Temp 97.7 F (36.5 C) (Skin)   Resp 16   Ht 5' 3.75" (1.619 m)   Wt 144 lb (65.3 kg)   BMI 24.91 kg/m  Height: 5' 3.75" (161.9 cm) Ht Readings from Last 3 Encounters:  02/03/19 5' 3.75" (1.619 m)    General appearance: alert, cooperative and appears stated age Head: Normocephalic, without obvious abnormality, atraumatic Neck: no adenopathy, supple, symmetrical, trachea midline and thyroid normal to inspection and palpation Lungs: clear to auscultation bilaterally Breasts: normal appearance, no masses or tenderness, No nipple retraction or dimpling, No nipple discharge or bleeding, No axillary or supraclavicular adenopathy Heart: regular rate and rhythm Abdomen: soft, non-tender; no masses,  no organomegaly Extremities: extremities normal, atraumatic, no cyanosis or edema Skin: Skin color, texture, turgor normal. No rashes or lesions Lymph nodes: Cervical, supraclavicular, and axillary nodes normal.  No abnormal inguinal nodes palpated Neurologic: Grossly normal   Pelvic: External genitalia:  no lesions              Urethra:  normal appearing urethra with no masses, tenderness or lesions              Bartholin's and Skene's: normal                 Vagina: normal appearing vagina with normal color and discharge, no lesions              Cervix: no cervical motion tenderness, no lesions and normal appearance              Pap taken: Yes.   Bimanual Exam:  Uterus:  normal size, contour, position, consistency, mobility, non-tender and tilts to left, with enlargement  palpated approximately 8 week size.              Adnexa: normal adnexa, no mass, fullness, tenderness and unable to palpate left adnexal well.               Rectovaginal: Confirms               Anus:  normal sphincter tone, no lesions  Chaperone present: yes  A:  Well Woman with normal exam  Contraception spouse vasectomy  ?perimenopausal vs menopausal with PMB ?  History of infertility with clomid use, 2 ectopic pregnancies  History of fibroids and partial removal ? Enlarged uterus   Vaginal dryness  History of Vitamin D deficiency    P:   Reviewed health and wellness pertinent to exam  Discussed lab work to help confirm possible menopause due to amenorrhea and scant bleeding concerns with PMB. Patient agreeable.  Given printed material regarding menopausal expectations and warning signs.  Labs: TSH,FSH,Prolactin  Discussed fibroids can also initiate bleeding in menopause and she may need PUS to assess. Patient agreeable.  Discussed Olive oil or coconut oil trial for vaginal dryness.   Lab: Vitamin D  Pap smear: yes   counseled on breast self exam, mammography screening, feminine hygiene, menopause, adequate intake of calcium and vitamin D, diet and exercise  return annually or prn  An After Visit Summary was printed and given to the patient.

## 2019-02-04 DIAGNOSIS — M47892 Other spondylosis, cervical region: Secondary | ICD-10-CM | POA: Diagnosis not present

## 2019-02-04 LAB — FOLLICLE STIMULATING HORMONE: FSH: 117 m[IU]/mL

## 2019-02-04 LAB — VITAMIN D 25 HYDROXY (VIT D DEFICIENCY, FRACTURES): Vit D, 25-Hydroxy: 31.1 ng/mL (ref 30.0–100.0)

## 2019-02-04 LAB — PROLACTIN: Prolactin: 34 ng/mL — ABNORMAL HIGH (ref 4.8–23.3)

## 2019-02-04 LAB — TSH: TSH: 2 u[IU]/mL (ref 0.450–4.500)

## 2019-02-05 LAB — CYTOLOGY - PAP
Diagnosis: NEGATIVE
HPV: NOT DETECTED

## 2019-02-06 ENCOUNTER — Telehealth: Payer: Self-pay | Admitting: Certified Nurse Midwife

## 2019-02-06 ENCOUNTER — Other Ambulatory Visit: Payer: Self-pay

## 2019-02-06 ENCOUNTER — Other Ambulatory Visit: Payer: Self-pay | Admitting: *Deleted

## 2019-02-06 DIAGNOSIS — N950 Postmenopausal bleeding: Secondary | ICD-10-CM

## 2019-02-06 DIAGNOSIS — R899 Unspecified abnormal finding in specimens from other organs, systems and tissues: Secondary | ICD-10-CM

## 2019-02-06 NOTE — Telephone Encounter (Signed)
Spoke with patient. See result notes. Advised patient Debbi wanted to do the PUS for evaluation of spotting due to patient being postmenopausal. Advised patient's should not have bleeding when postmenopausal. Patient verbalized understanding. Is scheduled for PUS on 02-10-2019 with Dr. Talbert Nan.   Routing to provider and will close encounter.

## 2019-02-06 NOTE — Telephone Encounter (Signed)
Good morning, Emily Jarvis! I just want to make sure I'm clear on what my test results mean. My [place] levels are 117 meaning I'm Post-Menopausal, therefore, I should not be having any spotting. Is that correct?    Thank you for any info you can give me. I'll be going in next week to get more labs done.     Thanks again!  Emily Jarvis

## 2019-02-09 DIAGNOSIS — M47892 Other spondylosis, cervical region: Secondary | ICD-10-CM | POA: Diagnosis not present

## 2019-02-09 NOTE — Progress Notes (Signed)
GYNECOLOGY  VISIT   HPI: 53 y.o.   Married White or Caucasian Not Hispanic or Latino  female   [phone] with No LMP recorded. (Menstrual status: Other).   here for evaluation of perimenopausal bleeding. Last real cycle in 11/19. She went 4-6 months without bleeding, then had spotting in 6/20 and in 7/20. Recent FSH 117, normal TSH, Prolactin was elevated at 34. She is coming back for a fasting prolactin   GYNECOLOGIC HISTORY: No LMP recorded. (Menstrual status: Other). Contraception:vasectomy Menopausal hormone therapy: none        OB History    Gravida  4   Para      Term      Preterm      AB  2   Living  2     SAB      TAB      Ectopic  2   Multiple      Live Births                 There are no active problems to display for this patient.   Past Medical History:  Diagnosis Date  . Anemia    as a child  . Fibroid   . Infertility, female    took clomid    Past Surgical History:  Procedure Laterality Date  . CESAREAN SECTION     times 2  . DILATION AND CURETTAGE OF UTERUS    . HAND TENDON SURGERY     right pinky  . HERNIA REPAIR    . SHOULDER SURGERY Right     Current Outpatient Medications  Medication Sig Dispense Refill  . meloxicam (MOBIC) 15 MG tablet      No current facility-administered medications for this visit.      ALLERGIES: Erythromycin base and Oxycodone-acetaminophen  Family History  Problem Relation Age of Onset  . Heart disease Father   . Cancer Maternal Grandmother   . Hypertension Paternal Grandfather   . Heart attack Paternal Grandfather     Social History   Socioeconomic History  . Marital status: Married    Spouse name: Not on file  . Number of children: Not on file  . Years of education: Not on file  . Highest education level: Not on file  Occupational History  . Not on file  Social Needs  . Financial resource strain: Not on file  . Food insecurity    Worry: Not on file    Inability: Not on file  .  Transportation needs    Medical: Not on file    Non-medical: Not on file  Tobacco Use  . Smoking status: Never Smoker  . Smokeless tobacco: Never Used  Substance and Sexual Activity  . Alcohol use: Yes    Comment: 2 a month  . Drug use: Never  . Sexual activity: Yes    Partners: Male    Birth control/protection: Other-see comments    Comment: husband vasectomy  Lifestyle  . Physical activity    Days per week: Not on file    Minutes per session: Not on file  . Stress: Not on file  Relationships  . Social Herbalist on phone: Not on file    Gets together: Not on file    Attends religious service: Not on file    Active member of club or organization: Not on file    Attends meetings of clubs or organizations: Not on file    Relationship status: Not on file  .  Intimate partner violence    Fear of current or ex partner: Not on file    Emotionally abused: Not on file    Physically abused: Not on file    Forced sexual activity: Not on file  Other Topics Concern  . Not on file  Social History Narrative  . Not on file    Review of Systems  Constitutional: Negative.   HENT: Negative.   Eyes: Negative.   Respiratory: Negative.   Cardiovascular: Negative.   Gastrointestinal: Negative.   Genitourinary: Negative.   Musculoskeletal: Negative.   Skin: Negative.   Neurological: Negative.   Endo/Heme/Allergies: Negative.   Psychiatric/Behavioral: Negative.     PHYSICAL EXAMINATION:    BP 118/80 (BP Location: Right Arm, Patient Position: Sitting, Cuff Size: Normal)   Pulse 64   Temp 98.3 F (36.8 C) (Skin)   Wt 146 lb 9.6 oz (66.5 kg)   BMI 25.36 kg/m     General appearance: alert, cooperative and appears stated age Neck: no adenopathy, supple, symmetrical, trachea midline and thyroid normal to inspection and palpation Heart: regular rate and rhythm Lungs: CTAB Abdomen: soft, non-tender; bowel sounds normal; no masses,  no organomegaly Extremities: normal,  atraumatic, no cyanosis Skin: normal color, texture and turgor, no rashes or lesions Lymph: normal cervical supraclavicular and inguinal nodes Neurologic: grossly normal   Pelvic: External genitalia:  no lesions              Urethra:  normal appearing urethra with no masses, tenderness or lesions              Bartholins and Skenes: normal                 Vagina: normal appearing vagina with normal color and discharge, no lesions              Cervix: no lesions               Sonohysterogram The procedure and risks of the procedure were reviewed with the patient, consent form was signed. A speculum was placed in the vagina and the cervix was cleansed with betadine. A tenaculum was placed on the anterior cervical lip and the cervix was dilated with the mini-dilators to the #4 hagar dilator.The sonohysterogram catheter was inserted into the uterine cavity without difficulty. Saline was infused under direct observation with the ultrasound. There was an intracavitary defect noticed at the fundus, suspect polyp, but not clear.The catheter was removed.     Chaperone was present for exam.  ASSESSMENT Perimenopausal with abnormal uterine bleeding, elevated FSH and prolactin. Intracavitary defect on sonohysterogram, suspect polyp.     PLAN Plan: hysteroscopy, polypectomy vs myomectomy, dilation and curettage. Reviewed risks, including: bleeding, infection, uterine perforation, fluid overload, need for further sugery    An After Visit Summary was printed and given to the patient.  ~25 minutes face to face time of which over 50% was spent in counseling.   CC: Evalee Mutton, CNM

## 2019-02-10 ENCOUNTER — Other Ambulatory Visit: Payer: Self-pay | Admitting: Obstetrics and Gynecology

## 2019-02-10 ENCOUNTER — Other Ambulatory Visit: Payer: Self-pay

## 2019-02-10 ENCOUNTER — Other Ambulatory Visit: Payer: Self-pay | Admitting: *Deleted

## 2019-02-10 ENCOUNTER — Encounter: Payer: Self-pay | Admitting: Obstetrics and Gynecology

## 2019-02-10 ENCOUNTER — Ambulatory Visit (INDEPENDENT_AMBULATORY_CARE_PROVIDER_SITE_OTHER): Payer: Federal, State, Local not specified - PPO | Admitting: Obstetrics and Gynecology

## 2019-02-10 ENCOUNTER — Ambulatory Visit (INDEPENDENT_AMBULATORY_CARE_PROVIDER_SITE_OTHER): Payer: Federal, State, Local not specified - PPO

## 2019-02-10 VITALS — BP 118/80 | HR 64 | Temp 98.3°F | Wt 146.6 lb

## 2019-02-10 DIAGNOSIS — N950 Postmenopausal bleeding: Secondary | ICD-10-CM | POA: Diagnosis not present

## 2019-02-10 DIAGNOSIS — N951 Menopausal and female climacteric states: Secondary | ICD-10-CM | POA: Diagnosis not present

## 2019-02-10 DIAGNOSIS — E229 Hyperfunction of pituitary gland, unspecified: Secondary | ICD-10-CM

## 2019-02-10 DIAGNOSIS — N882 Stricture and stenosis of cervix uteri: Secondary | ICD-10-CM

## 2019-02-10 DIAGNOSIS — N9489 Other specified conditions associated with female genital organs and menstrual cycle: Secondary | ICD-10-CM | POA: Diagnosis not present

## 2019-02-10 DIAGNOSIS — R7989 Other specified abnormal findings of blood chemistry: Secondary | ICD-10-CM

## 2019-02-10 DIAGNOSIS — N939 Abnormal uterine and vaginal bleeding, unspecified: Secondary | ICD-10-CM

## 2019-02-10 NOTE — Patient Instructions (Signed)
Hysteroscopy Hysteroscopy is a procedure that is used to examine the inside of a woman's womb (uterus). This may be done for various reasons, including:  To look for lumps (tumors) and other growths in the uterus.  To evaluate abnormal bleeding, fibroid tumors, polyps, scar tissue (adhesions), or cancer of the uterus.  To determine the cause of an inability to get pregnant (infertility) or repeated losses of pregnancies (miscarriages).  To find a lost IUD (intrauterine device).  To perform a procedure that permanently prevents pregnancy (sterilization). During this procedure, a thin, flexible tube with a small light and camera (hysteroscope) is used to examine the uterus. The camera sends images to a monitor in the room so that your health care provider can view the inside of your uterus. A hysteroscopy should be done right after a menstrual period to make sure that you are not pregnant. Tell a health care provider about:  Any allergies you have.  All medicines you are taking, including vitamins, herbs, eye drops, creams, and over-the-counter medicines.  Any problems you or family members have had with the use of anesthetic medicines.  Any blood disorders you have.  Any surgeries you have had.  Any medical conditions you have.  Whether you are pregnant or may be pregnant. What are the risks? Generally, this is a safe procedure. However, problems may occur, including:  Excessive bleeding.  Infection.  Damage to the uterus or other structures or organs.  Allergic reaction to medicines or fluids that are used in the procedure. What happens before the procedure? Staying hydrated Follow instructions from your health care provider about hydration, which may include:  Up to 2 hours before the procedure - you may continue to drink clear liquids, such as water, clear fruit juice, black coffee, and plain tea. Eating and drinking restrictions Follow instructions from your health care  provider about eating and drinking, which may include:  8 hours before the procedure - stop eating solid foods and drink clear liquids only  2 hours before the procedure - stop drinking clear liquids. General instructions  Ask your health care provider about: ? Changing or stopping your normal medicines. This is important if you take diabetes medicines or blood thinners. ? Taking medicines such as aspirin and ibuprofen. These medicines can thin your blood and cause bleeding. Do not take these medicines for 1 week before your procedure, or as told by your health care provider.  Do not use any products that contain nicotine or tobacco for 2 weeks before the procedure. This includes cigarettes and e-cigarettes. If you need help quitting, ask your health care provider.  Medicine may be placed in your cervix the day before the procedure. This medicine causes the cervix to have a larger opening (dilate). The larger opening makes it easier for the hysteroscope to be inserted into the uterus during the procedure.  Plan to have someone with you for the first 24-48 hours after the procedure, especially if you are given a medicine to make you fall asleep (general anesthetic).  Plan to have someone take you home from the hospital or clinic. What happens during the procedure?  To lower your risk of infection: ? Your health care team will wash or sanitize their hands. ? Your skin will be washed with soap. ? Hair may be removed from the surgical area.  An IV tube will be inserted into one of your veins.  You may be given one or more of the following: ? A medicine to help  you relax (sedative). ? A medicine that numbs the area around the cervix (local anesthetic). ? A medicine to make you fall asleep (general anesthetic).  A hysteroscope will be inserted through your vagina and into your uterus.  Air or fluid will be used to enlarge your uterus, enabling your health care provider to see your uterus  better. The amount of fluid used will be carefully checked throughout the procedure.  In some cases, tissue may be gently scraped from inside the uterus and sent to a lab for testing (biopsy). The procedure may vary among health care providers and hospitals. What happens after the procedure?  Your blood pressure, heart rate, breathing rate, and blood oxygen level will be monitored until the medicines you were given have worn off.  You may have some cramping. You may be given medicines for this.  You may have bleeding, which varies from light spotting to menstrual-like bleeding. This is normal.  If you had a biopsy done, it is your responsibility to get the results of your procedure. Ask your health care provider, or the department performing the procedure, when your results will be ready. Summary  Hysteroscopy is a procedure that is used to examine the inside of a woman's womb (uterus).  After the procedure, you may have bleeding, which varies from light spotting to menstrual-like bleeding. This is normal. You may also have cramping.  Plan to have someone take you home from the hospital or clinic. This information is not intended to replace advice given to you by your health care provider. Make sure you discuss any questions you have with your health care provider. Document Released: 09/17/2000 Document Revised: 05/24/2017 Document Reviewed: 07/10/2016 Elsevier Patient Education  2020 [employer].

## 2019-02-11 ENCOUNTER — Telehealth: Payer: Self-pay | Admitting: Obstetrics and Gynecology

## 2019-02-11 DIAGNOSIS — M47892 Other spondylosis, cervical region: Secondary | ICD-10-CM | POA: Diagnosis not present

## 2019-02-11 NOTE — Telephone Encounter (Signed)
Opened in error

## 2019-02-12 ENCOUNTER — Telehealth: Payer: Self-pay | Admitting: Obstetrics and Gynecology

## 2019-02-12 NOTE — Telephone Encounter (Signed)
Call placed to patient. Reviewed benefit for recommended surgery. Patient acknowledges understanding of information presented.  Patient aware this is professional benefit only. Patient aware will be contacted by hospital for separate benefits, once surgery has been scheduled. Patient to contact office on 03/16/2019 to confirm and proceed with scheduling.  Routing to [employer]

## 2019-02-13 DIAGNOSIS — Z0289 Encounter for other administrative examinations: Secondary | ICD-10-CM

## 2019-02-13 NOTE — Telephone Encounter (Signed)
Patient paid the prepayment and  is ready to proceed with scheduling her procedure.

## 2019-02-13 NOTE — Telephone Encounter (Signed)
Call to patient. Reviewed surgery date options and Covid testing requirements. Patient desires to proceed on 03-09-19. Surgery instruction sheet reviewed and printed copy along with hospital brochure and Covid testing instructions will be mailed.   Routing to provider. Encounter closed.

## 2019-02-16 ENCOUNTER — Other Ambulatory Visit: Payer: Self-pay

## 2019-02-16 ENCOUNTER — Other Ambulatory Visit (INDEPENDENT_AMBULATORY_CARE_PROVIDER_SITE_OTHER): Payer: Federal, State, Local not specified - PPO

## 2019-02-16 DIAGNOSIS — R899 Unspecified abnormal finding in specimens from other organs, systems and tissues: Secondary | ICD-10-CM | POA: Diagnosis not present

## 2019-02-16 DIAGNOSIS — M47892 Other spondylosis, cervical region: Secondary | ICD-10-CM | POA: Diagnosis not present

## 2019-02-17 ENCOUNTER — Other Ambulatory Visit: Payer: Self-pay | Admitting: Certified Nurse Midwife

## 2019-02-17 DIAGNOSIS — R6889 Other general symptoms and signs: Secondary | ICD-10-CM

## 2019-02-17 LAB — PROLACTIN: Prolactin: 30.2 ng/mL — ABNORMAL HIGH (ref 4.8–23.3)

## 2019-02-23 DIAGNOSIS — M47892 Other spondylosis, cervical region: Secondary | ICD-10-CM | POA: Diagnosis not present

## 2019-03-03 NOTE — H&P (Signed)
Office Visit  02/10/2019 [facility]   Talbert Nan, Francesca Jewett, MD Obstetrics and Gynecology  Abnormal uterine bleeding +4 more Dx  Advice Only   ; Referred by Kerry Dory, NP Reason for Visit  Additional Documentation  Vitals:   BP 118/80 (BP Location: Right Arm, Patient Position: Sitting, Cuff Size: Normal)  Pulse 64  Temp 98.3 F (36.8 C) (Skin)  Wt 66.5 kg  BMI 25.36 kg/m  BSA 1.73 m  Flowsheets:   MEWS Score,  Anthropometrics,  Method of Visit    Encounter Info:   Billing Info,  History,  Allergies,  Detailed Report    Orthostatic Vitals Recorded in This Encounter   02/10/2019  1427     Patient Position: Sitting  BP Location: Right Arm  Cuff Size: Normal  All Notes  Progress Notes by Orion Crook, RDMS at 02/10/2019 2:00 PM Author: Orion Crook, RDMS Author Type: Technician Filed: 02/10/2019 2:59 PM  Note Status: Signed Cosign: Cosign Not Required Encounter Date: 02/10/2019  Editor: Orion Crook, RDMS Environmental education officer)             Progress Notes by Salvadore Dom, MD at 02/10/2019 2:00 PM Author: Salvadore Dom, MD Author Type: Physician Filed: 02/10/2019 2:59 PM  Note Status: Signed Cosign: Cosign Not Required Encounter Date: 02/10/2019  Editor: Salvadore Dom, MD (Physician)  Prior Versions: 1. Sprague, Harley Hallmark, RN (Registered Nurse) at 02/10/2019 2:29 PM - Sign when Signing Visit   2. Salvadore Dom, MD (Physician) at 02/10/2019 2:21 PM - Sign when Signing Visit   3. Sprague, Harley Hallmark, RN (Registered Nurse) at 02/09/2019 1:16 PM - Sign when Signing Visit    GYNECOLOGY  VISIT   HPI: 53 y.o.   Married White or Caucasian Not Hispanic or Latino  female   [phone] with No LMP recorded. (Menstrual status: Other).   here for evaluation of perimenopausal bleeding. Last real cycle in 11/19. She went 4-6 months without bleeding, then had spotting in 6/20 and in 7/20. Recent FSH 117, normal TSH,  Prolactin was elevated at 34. She is coming back for a fasting prolactin   GYNECOLOGIC HISTORY: No LMP recorded. (Menstrual status: Other). Contraception:vasectomy Menopausal hormone therapy: none                OB History    Gravida  4   Para      Term      Preterm      AB  2   Living  2     SAB      TAB      Ectopic  2   Multiple      Live Births                 There are no active problems to display for this patient.       Past Medical History:  Diagnosis Date  . Anemia    as a child  . Fibroid   . Infertility, female    took clomid         Past Surgical History:  Procedure Laterality Date  . CESAREAN SECTION     times 2  . DILATION AND CURETTAGE OF UTERUS    . HAND TENDON SURGERY     right pinky  . HERNIA REPAIR    . SHOULDER SURGERY Right           Current Outpatient Medications  Medication Sig Dispense Refill  . meloxicam (MOBIC) 15  MG tablet      No current facility-administered medications for this visit.      ALLERGIES: Erythromycin base and Oxycodone-acetaminophen       Family History  Problem Relation Age of Onset  . Heart disease Father   . Cancer Maternal Grandmother   . Hypertension Paternal Grandfather   . Heart attack Paternal Grandfather     Social History        Socioeconomic History  . Marital status: Married    Spouse name: Not on file  . Number of children: Not on file  . Years of education: Not on file  . Highest education level: Not on file  Occupational History  . Not on file  Social Needs  . Financial resource strain: Not on file  . Food insecurity    Worry: Not on file    Inability: Not on file  . Transportation needs    Medical: Not on file    Non-medical: Not on file  Tobacco Use  . Smoking status: Never Smoker  . Smokeless tobacco: Never Used  Substance and Sexual Activity  . Alcohol use: Yes    Comment: 2 a month  . Drug use: Never   . Sexual activity: Yes    Partners: Male    Birth control/protection: Other-see comments    Comment: husband vasectomy  Lifestyle  . Physical activity    Days per week: Not on file    Minutes per session: Not on file  . Stress: Not on file  Relationships  . Social Herbalist on phone: Not on file    Gets together: Not on file    Attends religious service: Not on file    Active member of club or organization: Not on file    Attends meetings of clubs or organizations: Not on file    Relationship status: Not on file  . Intimate partner violence    Fear of current or ex partner: Not on file    Emotionally abused: Not on file    Physically abused: Not on file    Forced sexual activity: Not on file  Other Topics Concern  . Not on file  Social History Narrative  . Not on file    Review of Systems  Constitutional: Negative.   HENT: Negative.   Eyes: Negative.   Respiratory: Negative.   Cardiovascular: Negative.   Gastrointestinal: Negative.   Genitourinary: Negative.   Musculoskeletal: Negative.   Skin: Negative.   Neurological: Negative.   Endo/Heme/Allergies: Negative.   Psychiatric/Behavioral: Negative.     PHYSICAL EXAMINATION:    BP 118/80 (BP Location: Right Arm, Patient Position: Sitting, Cuff Size: Normal)   Pulse 64   Temp 98.3 F (36.8 C) (Skin)   Wt 146 lb 9.6 oz (66.5 kg)   BMI 25.36 kg/m     General appearance: alert, cooperative and appears stated age Neck: no adenopathy, supple, symmetrical, trachea midline and thyroid normal to inspection and palpation Heart: regular rate and rhythm Lungs: CTAB Abdomen: soft, non-tender; bowel sounds normal; no masses,  no organomegaly Extremities: normal, atraumatic, no cyanosis Skin: normal color, texture and turgor, no rashes or lesions Lymph: normal cervical supraclavicular and inguinal nodes Neurologic: grossly normal   Pelvic: External genitalia:  no lesions               Urethra:  normal appearing urethra with no masses, tenderness or lesions              Bartholins  and Skenes: normal                 Vagina: normal appearing vagina with normal color and discharge, no lesions              Cervix: no lesions               Sonohysterogram The procedure and risks of the procedure were reviewed with the patient, consent form was signed. A speculum was placed in the vagina and the cervix was cleansed with betadine. A tenaculum was placed on the anterior cervical lip and the cervix was dilated with the mini-dilators to the #4 hagar dilator.The sonohysterogram catheter was inserted into the uterine cavity without difficulty. Saline was infused under direct observation with the ultrasound. There was an intracavitary defect noticed at the fundus, suspect polyp, but not clear.The catheter was removed.     Chaperone was present for exam.  ASSESSMENT Perimenopausal with abnormal uterine bleeding, elevated FSH and prolactin. Intracavitary defect on sonohysterogram, suspect polyp.     PLAN Plan: hysteroscopy, polypectomy vs myomectomy, dilation and curettage. Reviewed risks, including: bleeding, infection, uterine perforation, fluid overload, need for further sugery    An After Visit Summary was printed and given to the patient.  ~25 minutes face to face time of which over 50% was spent in counseling.   CC: Evalee Mutton, CNM

## 2019-03-05 ENCOUNTER — Encounter: Payer: Self-pay | Admitting: Obstetrics and Gynecology

## 2019-03-05 ENCOUNTER — Other Ambulatory Visit (HOSPITAL_COMMUNITY)
Admission: RE | Admit: 2019-03-05 | Discharge: 2019-03-05 | Disposition: A | Discharge status: Home / Self Care | Payer: Federal, State, Local not specified - PPO | Source: Ambulatory Visit | Attending: Obstetrics and Gynecology | Admitting: Obstetrics and Gynecology

## 2019-03-05 DIAGNOSIS — Z20828 Contact with and (suspected) exposure to other viral communicable diseases: Secondary | ICD-10-CM | POA: Insufficient documentation

## 2019-03-05 DIAGNOSIS — Z01812 Encounter for preprocedural laboratory examination: Secondary | ICD-10-CM | POA: Insufficient documentation

## 2019-03-06 ENCOUNTER — Telehealth: Payer: Self-pay | Admitting: Obstetrics and Gynecology

## 2019-03-06 ENCOUNTER — Other Ambulatory Visit: Payer: Self-pay

## 2019-03-06 ENCOUNTER — Encounter (HOSPITAL_BASED_OUTPATIENT_CLINIC_OR_DEPARTMENT_OTHER): Payer: Self-pay | Admitting: *Deleted

## 2019-03-06 NOTE — Progress Notes (Signed)
Spoke w/ pt via phone for pre-op interview.  Npo after mn.  Arrive at 0530.  Needs urine preg. (LMP Oct 2019, it has not been a year).  Pt had covid test done yesterday.

## 2019-03-06 NOTE — Telephone Encounter (Signed)
Patient sent the following correspondence through [facility].  Hello! I got a Covid test this morning in preparation for my procedure on Monday. I'm assuming no news is good news, but I'm wondering when I should expect the results.  Thank you and have a great day! Emily Jarvis

## 2019-03-06 NOTE — Telephone Encounter (Signed)
Left message to call Sharee Pimple, RN at [facility].    MyChart message to patient.   Routing to Dr. Talbert Nan.   Encounter closed.

## 2019-03-07 LAB — NOVEL CORONAVIRUS, NAA (HOSP ORDER, SEND-OUT TO REF LAB; TAT 18-24 HRS): SARS-CoV-2, NAA: NOT DETECTED

## 2019-03-08 NOTE — Anesthesia Preprocedure Evaluation (Addendum)
Anesthesia Evaluation  Patient identified by MRN, date of birth, ID band Patient awake    Reviewed: Allergy & Precautions, NPO status , Patient's Chart, lab work & pertinent test results  History of Anesthesia Complications (+) PONV and history of anesthetic complications  Airway Mallampati: I  TM Distance: >3 FB Neck ROM: Full    Dental no notable dental hx.    Pulmonary neg pulmonary ROS,    Pulmonary exam normal breath sounds clear to auscultation       Cardiovascular negative cardio ROS Normal cardiovascular exam Rhythm:Regular Rate:Normal     Neuro/Psych negative neurological ROS  negative psych ROS   GI/Hepatic negative GI ROS, Neg liver ROS,   Endo/Other  negative endocrine ROS  Renal/GU negative Renal ROS     Musculoskeletal negative musculoskeletal ROS (+)   Abdominal   Peds  Hematology negative hematology ROS (+)   Anesthesia Other Findings AUB, eendometrial mass, cervical stenosis  Reproductive/Obstetrics                            Anesthesia Physical Anesthesia Plan  ASA: I  Anesthesia Plan: MAC   Post-op Pain Management:    Induction: Intravenous  PONV Risk Score and Plan: 3 and Ondansetron, Dexamethasone, Propofol infusion, Midazolam and Treatment may vary due to age or medical condition  Airway Management Planned: Simple Face Mask  Additional Equipment:   Intra-op Plan:   Post-operative Plan:   Informed Consent: I have reviewed the patients History and Physical, chart, labs and discussed the procedure including the risks, benefits and alternatives for the proposed anesthesia with the patient or authorized representative who has indicated his/her understanding and acceptance.     Dental advisory given  Plan Discussed with: CRNA  Anesthesia Plan Comments:        Anesthesia Quick Evaluation

## 2019-03-09 ENCOUNTER — Encounter (HOSPITAL_BASED_OUTPATIENT_CLINIC_OR_DEPARTMENT_OTHER)
Admission: RE | Disposition: A | Discharge status: Home / Self Care | Payer: Self-pay | Source: Home / Self Care | Attending: Obstetrics and Gynecology

## 2019-03-09 ENCOUNTER — Encounter (HOSPITAL_BASED_OUTPATIENT_CLINIC_OR_DEPARTMENT_OTHER): Payer: Self-pay | Admitting: *Deleted

## 2019-03-09 ENCOUNTER — Ambulatory Visit (HOSPITAL_BASED_OUTPATIENT_CLINIC_OR_DEPARTMENT_OTHER): Payer: Federal, State, Local not specified - PPO | Admitting: Anesthesiology

## 2019-03-09 ENCOUNTER — Ambulatory Visit (HOSPITAL_BASED_OUTPATIENT_CLINIC_OR_DEPARTMENT_OTHER)
Admission: RE | Admit: 2019-03-09 | Discharge: 2019-03-09 | Disposition: A | Discharge status: Home / Self Care | Payer: Federal, State, Local not specified - PPO | Attending: Obstetrics and Gynecology | Admitting: Obstetrics and Gynecology

## 2019-03-09 ENCOUNTER — Other Ambulatory Visit: Payer: Self-pay

## 2019-03-09 DIAGNOSIS — N840 Polyp of corpus uteri: Secondary | ICD-10-CM | POA: Insufficient documentation

## 2019-03-09 DIAGNOSIS — Z791 Long term (current) use of non-steroidal anti-inflammatories (NSAID): Secondary | ICD-10-CM | POA: Insufficient documentation

## 2019-03-09 DIAGNOSIS — N924 Excessive bleeding in the premenopausal period: Secondary | ICD-10-CM | POA: Diagnosis not present

## 2019-03-09 DIAGNOSIS — Z881 Allergy status to other antibiotic agents status: Secondary | ICD-10-CM | POA: Insufficient documentation

## 2019-03-09 DIAGNOSIS — M1990 Unspecified osteoarthritis, unspecified site: Secondary | ICD-10-CM | POA: Diagnosis not present

## 2019-03-09 DIAGNOSIS — Z885 Allergy status to narcotic agent status: Secondary | ICD-10-CM | POA: Diagnosis not present

## 2019-03-09 DIAGNOSIS — N939 Abnormal uterine and vaginal bleeding, unspecified: Secondary | ICD-10-CM | POA: Diagnosis not present

## 2019-03-09 HISTORY — DX: Nocturia: R35.1

## 2019-03-09 HISTORY — DX: Personal history of other diseases of the respiratory system: Z87.09

## 2019-03-09 HISTORY — PX: DILATATION & CURETTAGE/HYSTEROSCOPY WITH MYOSURE: SHX6511

## 2019-03-09 HISTORY — DX: Other specified postprocedural states: R11.2

## 2019-03-09 HISTORY — DX: Unspecified osteoarthritis, unspecified site: M19.90

## 2019-03-09 HISTORY — DX: Abnormal uterine and vaginal bleeding, unspecified: N93.9

## 2019-03-09 HISTORY — DX: Other specified postprocedural states: Z98.890

## 2019-03-09 LAB — POCT PREGNANCY, URINE: Preg Test, Ur: NEGATIVE

## 2019-03-09 SURGERY — DILATATION & CURETTAGE/HYSTEROSCOPY WITH MYOSURE
Anesthesia: Monitor Anesthesia Care

## 2019-03-09 MED ORDER — PROPOFOL 10 MG/ML IV BOLUS
INTRAVENOUS | Status: DC | PRN
  Administered 2019-03-09: 100 mg via INTRAVENOUS

## 2019-03-09 MED ORDER — FAMOTIDINE 20 MG PO TABS
20.0000 mg | ORAL_TABLET | Freq: Once | ORAL | Status: AC
  Administered 2019-03-09: 20 mg via ORAL
  Filled 2019-03-09: qty 1

## 2019-03-09 MED ORDER — ONDANSETRON HCL 4 MG/2ML IJ SOLN
INTRAMUSCULAR | Status: DC | PRN
  Administered 2019-03-09: 4 mg via INTRAVENOUS

## 2019-03-09 MED ORDER — LIDOCAINE 2% (20 MG/ML) 5 ML SYRINGE
INTRAMUSCULAR | Status: DC | PRN
  Administered 2019-03-09: 100 mg via INTRAVENOUS

## 2019-03-09 MED ORDER — FAMOTIDINE 20 MG PO TABS
ORAL_TABLET | ORAL | Status: AC
  Filled 2019-03-09: qty 1

## 2019-03-09 MED ORDER — PROPOFOL 10 MG/ML IV BOLUS
INTRAVENOUS | Status: AC
  Filled 2019-03-09: qty 40

## 2019-03-09 MED ORDER — PROPOFOL 500 MG/50ML IV EMUL
INTRAVENOUS | Status: DC | PRN
  Administered 2019-03-09: 150 ug/kg/min via INTRAVENOUS

## 2019-03-09 MED ORDER — KETOROLAC TROMETHAMINE 30 MG/ML IJ SOLN
30.0000 mg | Freq: Once | INTRAMUSCULAR | Status: DC | PRN
  Filled 2019-03-09: qty 1

## 2019-03-09 MED ORDER — DEXAMETHASONE SODIUM PHOSPHATE 10 MG/ML IJ SOLN
INTRAMUSCULAR | Status: DC | PRN
  Administered 2019-03-09: 10 mg via INTRAVENOUS

## 2019-03-09 MED ORDER — ACETAMINOPHEN 500 MG PO TABS
1000.0000 mg | ORAL_TABLET | Freq: Once | ORAL | Status: AC
  Administered 2019-03-09: 06:00:00 1000 mg via ORAL
  Filled 2019-03-09: qty 2

## 2019-03-09 MED ORDER — SODIUM CHLORIDE 0.9 % IV SOLN
INTRAVENOUS | Status: AC | PRN
  Administered 2019-03-09: 3000 mL via INTRAMUSCULAR

## 2019-03-09 MED ORDER — FENTANYL CITRATE (PF) 100 MCG/2ML IJ SOLN
INTRAMUSCULAR | Status: DC | PRN
  Administered 2019-03-09: 50 ug via INTRAVENOUS

## 2019-03-09 MED ORDER — PROMETHAZINE HCL 25 MG/ML IJ SOLN
6.2500 mg | INTRAMUSCULAR | Status: DC | PRN
  Filled 2019-03-09: qty 1

## 2019-03-09 MED ORDER — LIDOCAINE 2% (20 MG/ML) 5 ML SYRINGE
INTRAMUSCULAR | Status: AC
  Filled 2019-03-09: qty 5

## 2019-03-09 MED ORDER — LACTATED RINGERS IV SOLN
INTRAVENOUS | Status: DC
  Administered 2019-03-09: 07:00:00 via INTRAVENOUS
  Filled 2019-03-09: qty 1000

## 2019-03-09 MED ORDER — FENTANYL CITRATE (PF) 100 MCG/2ML IJ SOLN
INTRAMUSCULAR | Status: AC
  Filled 2019-03-09: qty 2

## 2019-03-09 MED ORDER — HYDROMORPHONE HCL 1 MG/ML IJ SOLN
0.2500 mg | INTRAMUSCULAR | Status: DC | PRN
  Filled 2019-03-09: qty 0.5

## 2019-03-09 MED ORDER — KETOROLAC TROMETHAMINE 30 MG/ML IJ SOLN
INTRAMUSCULAR | Status: DC | PRN
  Administered 2019-03-09: 30 mg via INTRAVENOUS

## 2019-03-09 MED ORDER — ACETAMINOPHEN 500 MG PO TABS
ORAL_TABLET | ORAL | Status: AC
  Filled 2019-03-09: qty 2

## 2019-03-09 MED ORDER — MIDAZOLAM HCL 2 MG/2ML IJ SOLN
INTRAMUSCULAR | Status: AC
  Filled 2019-03-09: qty 2

## 2019-03-09 MED ORDER — KETOROLAC TROMETHAMINE 30 MG/ML IJ SOLN
INTRAMUSCULAR | Status: AC
  Filled 2019-03-09: qty 1

## 2019-03-09 MED ORDER — MIDAZOLAM HCL 5 MG/5ML IJ SOLN
INTRAMUSCULAR | Status: DC | PRN
  Administered 2019-03-09: 2 mg via INTRAVENOUS

## 2019-03-09 MED ORDER — DEXAMETHASONE SODIUM PHOSPHATE 10 MG/ML IJ SOLN
INTRAMUSCULAR | Status: AC
  Filled 2019-03-09: qty 1

## 2019-03-09 MED ORDER — PROPOFOL 500 MG/50ML IV EMUL
INTRAVENOUS | Status: AC
  Filled 2019-03-09: qty 50

## 2019-03-09 MED ORDER — VASOPRESSIN 20 UNIT/ML IV SOLN
INTRAVENOUS | Status: DC | PRN
  Administered 2019-03-09: 1 mL via INTRAMUSCULAR

## 2019-03-09 MED ORDER — LACTATED RINGERS IV SOLN
INTRAVENOUS | Status: DC
  Filled 2019-03-09: qty 1000

## 2019-03-09 SURGICAL SUPPLY — 25 items
CANISTER SUCT 3000ML PPV (MISCELLANEOUS) IMPLANT
CATH ROBINSON RED A/P 16FR (CATHETERS) ×2 IMPLANT
COVER WAND RF STERILE (DRAPES) ×2 IMPLANT
DEVICE MYOSURE LITE (MISCELLANEOUS) IMPLANT
DEVICE MYOSURE REACH (MISCELLANEOUS) ×2 IMPLANT
DILATOR CANAL MILEX (MISCELLANEOUS) ×2 IMPLANT
GAUZE 4X4 16PLY RFD (DISPOSABLE) ×2 IMPLANT
GLOVE BIO SURGEON STRL SZ 6.5 (GLOVE) ×2 IMPLANT
GLOVE BIOGEL PI IND STRL 7.0 (GLOVE) ×2 IMPLANT
GLOVE BIOGEL PI INDICATOR 7.0 (GLOVE) ×2
GLOVE SURG SS PI 6.5 STRL IVOR (GLOVE) ×2 IMPLANT
GOWN STRL REUS W/TWL LRG LVL3 (GOWN DISPOSABLE) ×4 IMPLANT
IV NS IRRIG 3000ML ARTHROMATIC (IV SOLUTION) ×2 IMPLANT
KIT PROCEDURE FLUENT (KITS) ×2 IMPLANT
KIT TURNOVER CYSTO (KITS) ×2 IMPLANT
MYOSURE XL FIBROID (MISCELLANEOUS)
PACK VAGINAL MINOR WOMEN LF (CUSTOM PROCEDURE TRAY) ×2 IMPLANT
PAD OB MATERNITY 4.3X12.25 (PERSONAL CARE ITEMS) ×2 IMPLANT
PAD PREP 24X48 CUFFED NSTRL (MISCELLANEOUS) ×2 IMPLANT
SEAL CERVICAL OMNI LOK (ABLATOR) IMPLANT
SEAL ROD LENS SCOPE MYOSURE (ABLATOR) ×2 IMPLANT
SYR 20CC LL (SYRINGE) IMPLANT
SYSTEM TISS REMOVAL MYOSURE XL (MISCELLANEOUS) IMPLANT
TOWEL OR 17X26 10 PK STRL BLUE (TOWEL DISPOSABLE) ×2 IMPLANT
WATER STERILE IRR 500ML POUR (IV SOLUTION) IMPLANT

## 2019-03-09 NOTE — Anesthesia Procedure Notes (Signed)
Procedure Name: LMA Insertion Date/Time: 03/09/2019 7:33 AM Performed by: Bonney Aid, CRNA Pre-anesthesia Checklist: Patient identified, Emergency Drugs available, Suction available and Patient being monitored Patient Re-evaluated:Patient Re-evaluated prior to induction Oxygen Delivery Method: Circle system utilized Preoxygenation: Pre-oxygenation with 100% oxygen Induction Type: IV induction Ventilation: Mask ventilation without difficulty LMA: LMA inserted LMA Size: 4.0 Number of attempts: 1 Airway Equipment and Method: Bite block Placement Confirmation: positive ETCO2 Tube secured with: Tape Dental Injury: Teeth and Oropharynx as per pre-operative assessment

## 2019-03-09 NOTE — Anesthesia Procedure Notes (Signed)
Performed by: Felise Georgia T, CRNA       

## 2019-03-09 NOTE — Op Note (Signed)
Preoperative Diagnosis: Abnormal uterine bleeding, endometrial mass  Postoperative Diagnosis: Abnormal uterine bleeding, endometrial polyp.  Procedure: Hysteroscopy, polypectomy, dilation and curettage  Surgeon: Dr Sumner Boast  Assistants: None  Anesthesia: General via LMA  EBL: minimal  Fluids: 500 cc LR  Fluid deficit: 100 cc  Urine output: not measured  Indications for surgery: The patient is a 53 yo female, who presented with abnormal perimenopausal bleeding. Work up included an elevated [place], mildly elevated prolactin level (f/u level scheduled), and an intracavitary defect on sonohysterogram.  The risks of the surgery were reviewed with the patient and the consent form was signed prior to her surgery.  Findings: EUA: normal sized anteverted uterus, no adnexal masses. Hysteroscopy: endometrial polyp, otherwise thin appearing endometrium.  Specimens: endometrial polyp, endometrial curettings   Procedure: The patient was taken to the operating room with an IV in place. She was placed in the dorsal lithotomy position and anesthesia was administered. She was prepped and draped in the usual sterile fashion for a vaginal procedure. She voided on the way to the OR. A speculum was placed in the vagina and a single tooth tenaculum was placed on the anterior lip of the cervix. The cervix was dilated to a #7 hagar dilator. The uterus was sounded to 9 cm. The myosure hysteroscope was inserted into the uterine cavity. With continuous infusion of normal saline, the uterine cavity was visualized with the above findings. The myosure reach was used to resect the polyp. The myosure was then removed. The cavity was then curetted with the small sharp curette. The cavity had the characteristically gritty texture at the end of the procedure. The curette and the single tooth tenaculum were removed. Oozing from the tenaculum site was stopped with pressure. The speculum was removed. The patients perineum was  cleansed of betadine and she was taken out of the dorsal lithotomy position.  Upon awakening the LMA was removed and the patient was transferred to the recovery room in stable and awake condition.  The sponge and instrument count were correct. There were no complications.

## 2019-03-09 NOTE — Anesthesia Postprocedure Evaluation (Signed)
Anesthesia Post Note  Patient: Emily Jarvis  Procedure(s) Performed: DILATATION & CURETTAGE/HYSTEROSCOPY WITH MYOSURE, POLYPECTOMY (N/A )     Patient location during evaluation: PACU Anesthesia Type: General Level of consciousness: awake and alert Pain management: pain level controlled Vital Signs Assessment: post-procedure vital signs reviewed and stable Respiratory status: spontaneous breathing, nonlabored ventilation, respiratory function stable and patient connected to nasal cannula oxygen Cardiovascular status: blood pressure returned to baseline and stable Postop Assessment: no apparent nausea or vomiting Anesthetic complications: no    Last Vitals:  Vitals:   03/09/19 0845 03/09/19 0922  BP: (!) 130/53 (!) 149/73  Pulse: (!) 47 (!) 51  Resp: 13 14  Temp:  36.6 C  SpO2: 96% 100%    Last Pain:  Vitals:   03/09/19 0930  TempSrc:   PainSc: 2                  Emily Jarvis

## 2019-03-09 NOTE — Discharge Instructions (Signed)
°  NO ADVIL, ALEVE, MOTRIN, IBUPROFEN UNTIL 330 PM TODAY    Post Anesthesia Home Care Instructions  Activity: Get plenty of rest for the remainder of the day. A responsible adult should stay with you for 24 hours following the procedure.  For the next 24 hours, DO NOT: -Drive a car -Paediatric nurse -Drink alcoholic beverages -Take any medication unless instructed by your physician -Make any legal decisions or sign important papers.  Meals: Start with liquid foods such as gelatin or soup. Progress to regular foods as tolerated. Avoid greasy, spicy, heavy foods. If nausea and/or vomiting occur, drink only clear liquids until the nausea and/or vomiting subsides. Call your physician if vomiting continues.  Special Instructions/Symptoms: Your throat may feel dry or sore from the anesthesia or the breathing tube placed in your throat during surgery. If this causes discomfort, gargle with warm salt water. The discomfort should disappear within 24 hours.  If you had a scopolamine patch placed behind your ear for the management of post- operative nausea and/or vomiting:  1. The medication in the patch is effective for 72 hours, after which it should be removed.  Wrap patch in a tissue and discard in the trash. Wash hands thoroughly with soap and water. 2. You may remove the patch earlier than 72 hours if you experience unpleasant side effects which may include dry mouth, dizziness or visual disturbances. 3. Avoid touching the patch. Wash your hands with soap and water after contact with the patch.

## 2019-03-09 NOTE — Interval H&P Note (Signed)
History and Physical Interval Note:  03/09/2019 7:12 AM  Emily Jarvis  has presented today for surgery, with the diagnosis of AUB, eendometrial mass, cervical stenosis.  The various methods of treatment have been discussed with the patient and family. After consideration of risks, benefits and other options for treatment, the patient has consented to  Procedure(s) with comments: [place] (N/A) - polyp versus fibroid as a surgical intervention.  The patient's history has been reviewed, patient examined, no change in status, stable for surgery.  I have reviewed the patient's chart and labs.  Questions were answered to the patient's satisfaction.     Salvadore Dom

## 2019-03-09 NOTE — Transfer of Care (Signed)
Immediate Anesthesia Transfer of Care Note  Patient: Emily Jarvis  Procedure(s) Performed: DILATATION & CURETTAGE/HYSTEROSCOPY WITH MYOSURE (N/A )  Patient Location: PACU  Anesthesia Type:General  Level of Consciousness: awake, alert  and oriented  Airway & Oxygen Therapy: Patient Spontanous Breathing and Patient connected to nasal cannula oxygen  Post-op Assessment: Report given to RN  Post vital signs: Reviewed and stable  Last Vitals:  Vitals Value Taken Time  BP 150/89 03/09/19 0804  Temp    Pulse 53 03/09/19 0805  Resp 12 03/09/19 0805  SpO2 100 % 03/09/19 0805  Vitals shown include unvalidated device data.  Last Pain:  Vitals:   03/09/19 0620  TempSrc: Oral  PainSc: 2          Complications: No apparent anesthesia complications

## 2019-03-10 ENCOUNTER — Encounter (HOSPITAL_BASED_OUTPATIENT_CLINIC_OR_DEPARTMENT_OTHER): Payer: Self-pay | Admitting: Obstetrics and Gynecology

## 2019-03-10 NOTE — Progress Notes (Signed)
Left message

## 2019-03-23 ENCOUNTER — Other Ambulatory Visit: Payer: Self-pay

## 2019-03-23 ENCOUNTER — Other Ambulatory Visit: Payer: Federal, State, Local not specified - PPO

## 2019-03-23 DIAGNOSIS — R6889 Other general symptoms and signs: Secondary | ICD-10-CM | POA: Diagnosis not present

## 2019-03-24 LAB — PROLACTIN: Prolactin: 31.7 ng/mL — ABNORMAL HIGH (ref 4.8–23.3)

## 2019-03-25 ENCOUNTER — Other Ambulatory Visit: Payer: Self-pay

## 2019-03-25 ENCOUNTER — Encounter: Payer: Self-pay | Admitting: Obstetrics and Gynecology

## 2019-03-25 ENCOUNTER — Ambulatory Visit (INDEPENDENT_AMBULATORY_CARE_PROVIDER_SITE_OTHER): Payer: Federal, State, Local not specified - PPO | Admitting: Obstetrics and Gynecology

## 2019-03-25 ENCOUNTER — Telehealth: Payer: Self-pay | Admitting: *Deleted

## 2019-03-25 VITALS — BP 118/72 | HR 72 | Temp 97.3°F | Resp 12 | Ht 63.75 in | Wt 147.0 lb

## 2019-03-25 DIAGNOSIS — R7989 Other specified abnormal findings of blood chemistry: Secondary | ICD-10-CM

## 2019-03-25 DIAGNOSIS — E229 Hyperfunction of pituitary gland, unspecified: Secondary | ICD-10-CM

## 2019-03-25 DIAGNOSIS — Z9889 Other specified postprocedural states: Secondary | ICD-10-CM | POA: Diagnosis not present

## 2019-03-25 NOTE — Telephone Encounter (Signed)
Spoke with patient. Advised as seen below per Melvia Heaps, CNM and Dr. Talbert Nan. Patient request to proceed with MRI Brain with contrast. Order placed for Emily Jarvis. Advised patient [place] Jarvis will contact her directly to schedule, once scheduled our office will precert. Patient verbalizes understanding and is agreeable.   Routing to provider for final review. Patient is agreeable to disposition. Will close encounter.  Cc: Dr. Talbert Nan, Lerry Liner

## 2019-03-25 NOTE — Telephone Encounter (Signed)
Notes recorded by Burnice Logan, RN on 03/25/2019 at 8:49 AM EDT  Left message to call Sharee Pimple, RN at [facility].     Order pended for MRI Brain with contrast to [facility]

## 2019-03-25 NOTE — Telephone Encounter (Signed)
-----   Message from Regina Eck, CNM sent at 03/24/2019  1:03 PM EDT ----- Notify patient her Prolactin level is still elevated at 31.7 more than previous at 30.2. Discussed with Dr Talbert Nan and feels she needs MRI with contrast scheduled. Please schedule. Dr. Talbert Nan will need sign order.

## 2019-03-25 NOTE — Progress Notes (Signed)
GYNECOLOGY  VISIT   HPI: 53 y.o.   Married  Caucasian  female   [phone] with No LMP recorded (lmp unknown). Patient is perimenopausal.   here for 2 week follow up status post [place], POLYPECTOMY (N/A ). Pathology with benign polyp, benign endometrium. She spotted for almost 2 weeks, no pain.     GYNECOLOGIC HISTORY: No LMP recorded (lmp unknown). Patient is perimenopausal. Contraception:  Vasectomy Menopausal hormone therapy: none Last mammogram: 06-11-18 Diag.Bil./multiple benign cysts Lt.Br./screening 55yr./density C/BiRads2 Last pap smear: 02-03-19 Neg:Neg HR HPV        OB History    Gravida  4   Para      Term      Preterm      AB  2   Living  2     SAB      TAB      Ectopic  2   Multiple      Live Births                 There are no active problems to display for this patient.   Past Medical History:  Diagnosis Date  . Abnormal uterine bleeding (AUB)   . Arthritis    neck  . Fibroid   . History of asthma    child  . Nocturia   . PONV (postoperative nausea and vomiting)     Past Surgical History:  Procedure Laterality Date  . [place];  2000  . DILATATION & CURETTAGE/HYSTEROSCOPY WITH MYOSURE N/A 03/09/2019   Procedure: DILATATION & CURETTAGE/HYSTEROSCOPY WITH MYOSURE, POLYPECTOMY;  Surgeon: Salvadore Dom, MD;  Location: [place];  Service: Gynecology;  Laterality: N/A;  . DILATION AND CURETTAGE OF UTERUS  2018  . HAND TENDON SURGERY Right 2014   little finger  . SHOULDER ARTHROSCOPY Right 1991  . UMBILICAL HERNIA REPAIR  2016    Current Outpatient Medications  Medication Sig Dispense Refill  . Multiple Vitamin (MULTIVITAMIN) tablet Take 1 tablet by mouth daily.    . Omega-3 Fatty Acids (FISH OIL) 1000 MG CAPS Take by mouth.    Marland Kitchen VITAMIN D PO Take 1,000 Units by mouth.     No current facility-administered medications for this visit.      ALLERGIES:  Erythromycin base and Oxycodone  Family History  Problem Relation Age of Onset  . Heart disease Father   . Cancer Maternal Grandmother   . Hypertension Paternal Grandfather   . Heart attack Paternal Grandfather     Social History   Socioeconomic History  . Marital status: Married    Spouse name: Not on file  . Number of children: Not on file  . Years of education: Not on file  . Highest education level: Not on file  Occupational History  . Not on file  Social Needs  . Financial resource strain: Not on file  . Food insecurity    Worry: Not on file    Inability: Not on file  . Transportation needs    Medical: Not on file    Non-medical: Not on file  Tobacco Use  . Smoking status: Never Smoker  . Smokeless tobacco: Never Used  Substance and Sexual Activity  . Alcohol use: Yes    Comment: occasional  . Drug use: Never  . Sexual activity: Yes    Partners: Male    Birth control/protection: Other-see comments    Comment: husband vasectomy  Lifestyle  . Physical activity  Days per week: Not on file    Minutes per session: Not on file  . Stress: Not on file  Relationships  . Social Herbalist on phone: Not on file    Gets together: Not on file    Attends religious service: Not on file    Active member of club or organization: Not on file    Attends meetings of clubs or organizations: Not on file    Relationship status: Not on file  . Intimate partner violence    Fear of current or ex partner: Not on file    Emotionally abused: Not on file    Physically abused: Not on file    Forced sexual activity: Not on file  Other Topics Concern  . Not on file  Social History Narrative  . Not on file    Review of Systems  Constitutional: Negative.   HENT: Negative.   Eyes: Negative.   Respiratory: Negative.   Cardiovascular: Negative.   Gastrointestinal: Negative.   Endocrine: Negative.   Genitourinary: Negative.   Musculoskeletal: Negative.   Skin:  Negative.   Allergic/Immunologic: Negative.   Neurological: Negative.   Hematological: Negative.   Psychiatric/Behavioral: Negative.     PHYSICAL EXAMINATION:    BP 118/72 (BP Location: Right Arm, Patient Position: Sitting, Cuff Size: Normal)   Pulse 72   Temp (!) 97.3 F (36.3 C) (Temporal)   Resp 12   Ht 5' 3.75" (1.619 m)   Wt 147 lb (66.7 kg)   LMP  (LMP Unknown)   BMI 25.43 kg/m     General appearance: alert, cooperative and appears stated age Abdomen: soft, non-tender, no masses,  no organomegaly   ASSESSMENT S/P hysteroscopy, polypectomy, D&C, negative pathology Mildly elevated prolactin level    PLAN Call with further bleeding MRI of pituitary being scheduled  CC: Evalee Mutton, CNM

## 2019-04-07 DIAGNOSIS — Z23 Encounter for immunization: Secondary | ICD-10-CM | POA: Diagnosis not present

## 2019-04-07 DIAGNOSIS — Z13220 Encounter for screening for lipoid disorders: Secondary | ICD-10-CM | POA: Diagnosis not present

## 2019-04-07 DIAGNOSIS — Z111 Encounter for screening for respiratory tuberculosis: Secondary | ICD-10-CM | POA: Diagnosis not present

## 2019-04-07 DIAGNOSIS — Z0000 Encounter for general adult medical examination without abnormal findings: Secondary | ICD-10-CM | POA: Diagnosis not present

## 2019-04-22 ENCOUNTER — Other Ambulatory Visit: Payer: Self-pay

## 2019-04-22 ENCOUNTER — Ambulatory Visit
Admission: RE | Admit: 2019-04-22 | Discharge: 2019-04-22 | Disposition: A | Discharge status: Home / Self Care | Payer: Federal, State, Local not specified - PPO | Source: Ambulatory Visit | Attending: Obstetrics and Gynecology | Admitting: Obstetrics and Gynecology

## 2019-04-22 DIAGNOSIS — J341 Cyst and mucocele of nose and nasal sinus: Secondary | ICD-10-CM | POA: Diagnosis not present

## 2019-04-22 DIAGNOSIS — R7989 Other specified abnormal findings of blood chemistry: Secondary | ICD-10-CM

## 2019-04-22 DIAGNOSIS — E236 Other disorders of pituitary gland: Secondary | ICD-10-CM | POA: Diagnosis not present

## 2019-04-22 DIAGNOSIS — J3489 Other specified disorders of nose and nasal sinuses: Secondary | ICD-10-CM | POA: Diagnosis not present

## 2019-04-22 MED ORDER — GADOBENATE DIMEGLUMINE 529 MG/ML IV SOLN
10.0000 mL | Freq: Once | INTRAVENOUS | Status: AC | PRN
  Administered 2019-04-22: 10 mL via INTRAVENOUS

## 2019-04-29 ENCOUNTER — Other Ambulatory Visit: Payer: Self-pay | Admitting: Obstetrics and Gynecology

## 2019-04-29 DIAGNOSIS — Q283 Other malformations of cerebral vessels: Secondary | ICD-10-CM

## 2019-04-29 DIAGNOSIS — R7989 Other specified abnormal findings of blood chemistry: Secondary | ICD-10-CM

## 2019-04-29 DIAGNOSIS — D369 Benign neoplasm, unspecified site: Secondary | ICD-10-CM

## 2019-05-08 DIAGNOSIS — E229 Hyperfunction of pituitary gland, unspecified: Secondary | ICD-10-CM | POA: Diagnosis not present

## 2019-05-08 DIAGNOSIS — D352 Benign neoplasm of pituitary gland: Secondary | ICD-10-CM | POA: Diagnosis not present

## 2019-05-19 ENCOUNTER — Encounter: Payer: Self-pay | Admitting: Obstetrics and Gynecology

## 2019-05-19 DIAGNOSIS — D352 Benign neoplasm of pituitary gland: Secondary | ICD-10-CM | POA: Insufficient documentation

## 2019-05-19 HISTORY — DX: Benign neoplasm of pituitary gland: D35.2

## 2019-06-09 ENCOUNTER — Telehealth: Payer: Self-pay | Admitting: *Deleted

## 2019-06-09 NOTE — Telephone Encounter (Signed)
Yes, please remove from imaging hold.

## 2019-06-09 NOTE — Telephone Encounter (Signed)
Patient is in imaging hold for MRI Brain w/wo contrast for elevated prolactin level.   MRI completed on 04/22/19.  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   Dr. Talbert Nan -ok to remove from imaging hold?

## 2019-06-10 NOTE — Telephone Encounter (Signed)
Patient removed from IMG hold.   Encounter closed.

## 2019-06-16 ENCOUNTER — Ambulatory Visit: Payer: Federal, State, Local not specified - PPO | Attending: Internal Medicine

## 2019-06-16 DIAGNOSIS — Z20822 Contact with and (suspected) exposure to covid-19: Secondary | ICD-10-CM

## 2019-06-16 DIAGNOSIS — Z20828 Contact with and (suspected) exposure to other viral communicable diseases: Secondary | ICD-10-CM | POA: Diagnosis not present

## 2019-06-17 LAB — NOVEL CORONAVIRUS, NAA: SARS-CoV-2, NAA: NOT DETECTED

## 2019-07-20 ENCOUNTER — Ambulatory Visit: Payer: Federal, State, Local not specified - PPO | Attending: Internal Medicine

## 2019-07-20 DIAGNOSIS — Z20822 Contact with and (suspected) exposure to covid-19: Secondary | ICD-10-CM | POA: Diagnosis not present

## 2019-07-21 LAB — NOVEL CORONAVIRUS, NAA: SARS-CoV-2, NAA: NOT DETECTED

## 2019-08-04 DIAGNOSIS — Z1211 Encounter for screening for malignant neoplasm of colon: Secondary | ICD-10-CM | POA: Diagnosis not present

## 2019-08-04 DIAGNOSIS — Z8371 Family history of colonic polyps: Secondary | ICD-10-CM | POA: Diagnosis not present

## 2019-09-14 ENCOUNTER — Encounter: Payer: Self-pay | Admitting: Certified Nurse Midwife

## 2019-11-03 ENCOUNTER — Other Ambulatory Visit: Payer: Self-pay | Admitting: Neurosurgery

## 2019-11-03 DIAGNOSIS — E229 Hyperfunction of pituitary gland, unspecified: Secondary | ICD-10-CM

## 2019-11-03 DIAGNOSIS — D352 Benign neoplasm of pituitary gland: Secondary | ICD-10-CM

## 2019-11-20 DIAGNOSIS — D352 Benign neoplasm of pituitary gland: Secondary | ICD-10-CM | POA: Diagnosis not present

## 2019-12-04 ENCOUNTER — Other Ambulatory Visit: Payer: Federal, State, Local not specified - PPO

## 2019-12-05 ENCOUNTER — Other Ambulatory Visit: Payer: Self-pay

## 2019-12-05 ENCOUNTER — Ambulatory Visit
Admission: RE | Admit: 2019-12-05 | Discharge: 2019-12-05 | Disposition: A | Discharge status: Home / Self Care | Payer: Federal, State, Local not specified - PPO | Source: Ambulatory Visit | Attending: Neurosurgery | Admitting: Neurosurgery

## 2019-12-05 DIAGNOSIS — D352 Benign neoplasm of pituitary gland: Secondary | ICD-10-CM | POA: Diagnosis not present

## 2019-12-05 DIAGNOSIS — E229 Hyperfunction of pituitary gland, unspecified: Secondary | ICD-10-CM

## 2019-12-05 MED ORDER — GADOBENATE DIMEGLUMINE 529 MG/ML IV SOLN
7.0000 mL | Freq: Once | INTRAVENOUS | Status: AC | PRN
  Administered 2019-12-05: 7 mL via INTRAVENOUS

## 2019-12-17 DIAGNOSIS — Z1159 Encounter for screening for other viral diseases: Secondary | ICD-10-CM | POA: Diagnosis not present

## 2019-12-21 DIAGNOSIS — Z1211 Encounter for screening for malignant neoplasm of colon: Secondary | ICD-10-CM | POA: Diagnosis not present

## 2019-12-21 DIAGNOSIS — Z8371 Family history of colonic polyps: Secondary | ICD-10-CM | POA: Diagnosis not present

## 2020-02-22 NOTE — Progress Notes (Signed)
54 y.o. [id number] Married White or Caucasian Not Hispanic or Latino female here for annual exam. Patient is not happy with her weight.  Some vaginal dryness with intercourse, hasn't tried a lubricant year.   No vaginal bleeding since the D&C last year.   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   No LMP recorded (lmp unknown). Patient is postmenopausal.          Sexually active: Yes.    The current method of family planning is post menopausal status.    Exercising: Yes.    Walking, yoga, Cardio Smoker:  no  Health Maintenance: Pap:  02/03/19 WNL HR HPV neg  History of abnormal Pap:  no MMG:  06/11/18 [place] left BIRADS2:benign. F/u 1  Year  BMD:  None  Colonoscopy: this summer, normal. Told F/U in 5 years (mom with polyps) TDaP:  2014  Gardasil: NA   reports that she has never smoked. She has never used smokeless tobacco. She reports current alcohol use. She reports that she does not use drugs. 1 drink a week. She is an Armed forces training and education officer. Daughter in [place], [place]. Son is here.   Past Medical History:  Diagnosis Date  . Abnormal uterine bleeding (AUB)   . Arthritis    neck  . Fibroid   . History of asthma    child  . Nocturia   . Pituitary microadenoma ([place]) 05/19/2019  . PONV (postoperative nausea and vomiting)     Past Surgical History:  Procedure Laterality Date  . [place];  2000  . DILATATION & CURETTAGE/HYSTEROSCOPY WITH MYOSURE N/A 03/09/2019   Procedure: DILATATION & CURETTAGE/HYSTEROSCOPY WITH MYOSURE, POLYPECTOMY;  Surgeon: Salvadore Dom, MD;  Location: [place];  Service: Gynecology;  Laterality: N/A;  . DILATION AND CURETTAGE OF UTERUS  2018  . HAND TENDON SURGERY Right 2014   little finger  . SHOULDER ARTHROSCOPY Right 1991  . UMBILICAL HERNIA REPAIR  2016    Current Outpatient  Medications  Medication Sig Dispense Refill  . Multiple Vitamin (MULTIVITAMIN) tablet Take 1 tablet by mouth daily.    . Omega-3 Fatty Acids (FISH OIL) 1000 MG CAPS Take by mouth.    Marland Kitchen VITAMIN D PO Take 1,000 Units by mouth.     No current facility-administered medications for this visit.    Family History  Problem Relation Age of Onset  . Heart disease Father   . Cancer Maternal Grandmother   . Hypertension Paternal Grandfather   . Heart attack Paternal Grandfather     Review of Systems  All other systems reviewed and are negative.   Exam:   BP 108/64   Pulse 73   Ht 5\' 4"  (1.626 m)   Wt 154 lb 12.8 oz (70.2 kg)   LMP  (LMP Unknown)   SpO2 99%   BMI 26.57 kg/m   Weight change: @WEIGHTCHANGE @ Height:   Height: 5\' 4"  (162.6 cm)  Ht Readings from Last 3 Encounters:  02/24/20 5\' 4"  (1.626 m)  03/25/19 5' 3.75" (1.619 m)  03/09/19 5' 3.75" (1.619 m)    General appearance: alert, cooperative and appears stated age Head: Normocephalic, without obvious abnormality, atraumatic Neck: no adenopathy, supple, symmetrical, trachea midline and thyroid normal to inspection and palpation Lungs: clear to auscultation bilaterally Cardiovascular: regular rate and rhythm Breasts: in the left breast at 10 o'clock is a  2 cm, smooth mobile lump. The patient states it has gotten smaller with time. Previously diagnosed cyst.  Abdomen: soft, non-tender; non distended,  no masses,  no organomegaly Extremities: extremities normal, atraumatic, no cyanosis or edema Skin: Skin color, texture, turgor normal. No rashes or lesions Lymph nodes: Cervical, supraclavicular, and axillary nodes normal. No abnormal inguinal nodes palpated Neurologic: Grossly normal   Pelvic: External genitalia:  no lesions              Urethra:  normal appearing urethra with no masses, tenderness or lesions              Bartholins and Skenes: normal                 Vagina: normal appearing vagina with normal color and  discharge, no lesions              Cervix: no lesions               Bimanual Exam:  Uterus:  normal size, contour, position, consistency, mobility, non-tender              Adnexa: no mass, fullness, tenderness               Rectovaginal: Confirms               Anus:  normal sphincter tone, no lesions  Shanon Petty chaperoned for the exam.  A:  Well Woman with normal exam  Vit d def  H/O microadenoma  P:   No pap this year  Screening labs, prolactin, vit d  Discussed breast self exam  Discussed calcium and vit D intake  Mammogram due, she will schedule  Colonoscopy

## 2020-02-24 ENCOUNTER — Encounter: Payer: Self-pay | Admitting: Obstetrics and Gynecology

## 2020-02-24 ENCOUNTER — Other Ambulatory Visit: Payer: Self-pay

## 2020-02-24 ENCOUNTER — Ambulatory Visit: Payer: Federal, State, Local not specified - PPO | Admitting: Obstetrics and Gynecology

## 2020-02-24 VITALS — BP 108/64 | HR 73 | Ht 64.0 in | Wt 154.8 lb

## 2020-02-24 DIAGNOSIS — E559 Vitamin D deficiency, unspecified: Secondary | ICD-10-CM

## 2020-02-24 DIAGNOSIS — D369 Benign neoplasm, unspecified site: Secondary | ICD-10-CM

## 2020-02-24 DIAGNOSIS — Z01419 Encounter for gynecological examination (general) (routine) without abnormal findings: Secondary | ICD-10-CM | POA: Diagnosis not present

## 2020-02-24 DIAGNOSIS — Z0000 Encounter for general adult medical examination without abnormal findings: Secondary | ICD-10-CM | POA: Diagnosis not present

## 2020-02-24 DIAGNOSIS — R7989 Other specified abnormal findings of blood chemistry: Secondary | ICD-10-CM

## 2020-02-24 NOTE — Patient Instructions (Signed)

## 2020-02-25 LAB — COMPREHENSIVE METABOLIC PANEL
ALT: 18 IU/L (ref 0–32)
AST: 20 IU/L (ref 0–40)
Albumin/Globulin Ratio: 2.1 (ref 1.2–2.2)
Albumin: 4.8 g/dL (ref 3.8–4.9)
Alkaline Phosphatase: 83 IU/L (ref 48–121)
BUN/Creatinine Ratio: 24 — ABNORMAL HIGH (ref 9–23)
BUN: 20 mg/dL (ref 6–24)
Bilirubin Total: <0.2 mg/dL (ref 0.0–1.2)
CO2: 27 mmol/L (ref 20–29)
Calcium: 9.5 mg/dL (ref 8.7–10.2)
Chloride: 100 mmol/L (ref 96–106)
Creatinine, Ser: 0.83 mg/dL (ref 0.57–1.00)
GFR calc Af Amer: 92 mL/min/{1.73_m2} (ref >59)
GFR calc non Af Amer: 80 mL/min/{1.73_m2} (ref >59)
Globulin, Total: 2.3 g/dL (ref 1.5–4.5)
Glucose: 93 mg/dL (ref 65–99)
Potassium: 4.1 mmol/L (ref 3.5–5.2)
Sodium: 140 mmol/L (ref 134–144)
Total Protein: 7.1 g/dL (ref 6.0–8.5)

## 2020-02-25 LAB — LIPID PANEL
Chol/HDL Ratio: 3.6 ratio (ref 0.0–4.4)
Cholesterol, Total: 226 mg/dL — ABNORMAL HIGH (ref 100–199)
HDL: 62 mg/dL (ref >39)
LDL Chol Calc (NIH): 137 mg/dL — ABNORMAL HIGH (ref 0–99)
Triglycerides: 155 mg/dL — ABNORMAL HIGH (ref 0–149)
VLDL Cholesterol Cal: 27 mg/dL (ref 5–40)

## 2020-02-25 LAB — CBC
Hematocrit: 40 % (ref 34.0–46.6)
Hemoglobin: 13.4 g/dL (ref 11.1–15.9)
MCH: 30.4 pg (ref 26.6–33.0)
MCHC: 33.5 g/dL (ref 31.5–35.7)
MCV: 91 fL (ref 79–97)
Platelets: 261 10*3/uL (ref 150–450)
RBC: 4.41 x10E6/uL (ref 3.77–5.28)
RDW: 12.3 % (ref 11.7–15.4)
WBC: 7.9 10*3/uL (ref 3.4–10.8)

## 2020-02-25 LAB — VITAMIN D 25 HYDROXY (VIT D DEFICIENCY, FRACTURES): Vit D, 25-Hydroxy: 39.8 ng/mL (ref 30.0–100.0)

## 2020-02-25 LAB — PROLACTIN: Prolactin: 29.5 ng/mL — ABNORMAL HIGH (ref 4.8–23.3)

## 2020-03-07 ENCOUNTER — Other Ambulatory Visit: Payer: Self-pay | Admitting: Obstetrics and Gynecology

## 2020-03-07 DIAGNOSIS — Z1231 Encounter for screening mammogram for malignant neoplasm of breast: Secondary | ICD-10-CM

## 2020-03-22 ENCOUNTER — Other Ambulatory Visit: Payer: Self-pay

## 2020-03-22 ENCOUNTER — Ambulatory Visit
Admission: RE | Admit: 2020-03-22 | Discharge: 2020-03-22 | Disposition: A | Discharge status: Home / Self Care | Payer: Federal, State, Local not specified - PPO | Source: Ambulatory Visit | Attending: Obstetrics and Gynecology | Admitting: Obstetrics and Gynecology

## 2020-03-22 DIAGNOSIS — Z1231 Encounter for screening mammogram for malignant neoplasm of breast: Secondary | ICD-10-CM | POA: Diagnosis not present

## 2020-09-01 DIAGNOSIS — M79672 Pain in left foot: Secondary | ICD-10-CM | POA: Insufficient documentation

## 2020-09-01 DIAGNOSIS — M25561 Pain in right knee: Secondary | ICD-10-CM | POA: Diagnosis not present

## 2020-09-26 DIAGNOSIS — M5459 Other low back pain: Secondary | ICD-10-CM | POA: Diagnosis not present

## 2020-11-20 DIAGNOSIS — R21 Rash and other nonspecific skin eruption: Secondary | ICD-10-CM | POA: Diagnosis not present

## 2020-11-20 DIAGNOSIS — Z6825 Body mass index (BMI) 25.0-25.9, adult: Secondary | ICD-10-CM | POA: Diagnosis not present

## 2020-12-12 DIAGNOSIS — H1012 Acute atopic conjunctivitis, left eye: Secondary | ICD-10-CM | POA: Diagnosis not present

## 2021-02-21 DIAGNOSIS — L718 Other rosacea: Secondary | ICD-10-CM | POA: Diagnosis not present

## 2021-02-21 DIAGNOSIS — L812 Freckles: Secondary | ICD-10-CM | POA: Diagnosis not present

## 2021-02-21 DIAGNOSIS — D229 Melanocytic nevi, unspecified: Secondary | ICD-10-CM | POA: Diagnosis not present

## 2021-02-21 DIAGNOSIS — L821 Other seborrheic keratosis: Secondary | ICD-10-CM | POA: Diagnosis not present

## 2021-02-22 DIAGNOSIS — Z0000 Encounter for general adult medical examination without abnormal findings: Secondary | ICD-10-CM | POA: Diagnosis not present

## 2021-02-22 DIAGNOSIS — Z13220 Encounter for screening for lipoid disorders: Secondary | ICD-10-CM | POA: Diagnosis not present

## 2021-02-22 DIAGNOSIS — Z23 Encounter for immunization: Secondary | ICD-10-CM | POA: Diagnosis not present

## 2021-03-06 ENCOUNTER — Other Ambulatory Visit: Payer: Self-pay

## 2021-03-06 ENCOUNTER — Ambulatory Visit (INDEPENDENT_AMBULATORY_CARE_PROVIDER_SITE_OTHER): Payer: Federal, State, Local not specified - PPO | Admitting: Obstetrics and Gynecology

## 2021-03-06 ENCOUNTER — Encounter: Payer: Self-pay | Admitting: Obstetrics and Gynecology

## 2021-03-06 VITALS — BP 118/72 | HR 57 | Ht 64.0 in | Wt 146.0 lb

## 2021-03-06 DIAGNOSIS — Z01419 Encounter for gynecological examination (general) (routine) without abnormal findings: Secondary | ICD-10-CM

## 2021-03-06 DIAGNOSIS — D369 Benign neoplasm, unspecified site: Secondary | ICD-10-CM

## 2021-03-06 NOTE — Progress Notes (Signed)
55 y.o. [id number] Married White or Caucasian Not Hispanic or Latino female here for annual exam.  No vaginal bleeding. Sexually active, no pain.  No bowel or bladder c/o.   No medical changes, recent labs with primary.   Diagnosed with pituitary microadenoma in 2020, repeat MRI in 6/21 with decreased size of pituitary gland, 65m microadenoma seen. No symptoms.     No LMP recorded (lmp unknown). Patient is postmenopausal.          Sexually active: Yes.    The current method of family planning is status post hysterectomy.    Exercising: Yes.     Walking  Smoker:  no  Health Maintenance: Pap:  02/03/19 WNL HR HPV neg  History of abnormal Pap:  no MMG:  03/24/20  Bi-rads 1neg  BMD:   none  Colonoscopy: summer of 2021 was normal F/U 5 years  per patient  TDaP:  2014  Gardasil: na    reports that she has never smoked. She has never used smokeless tobacco. She reports current alcohol use. She reports that she does not use drugs. 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.   Past Medical History:  Diagnosis Date   Abnormal uterine bleeding (AUB)    Arthritis    neck   Fibroid    History of asthma    child   Nocturia    [place]oma ([place] 05/19/[place]   PONV (postoperative nausea and vomiting)     Past Surgical History:  Procedure Laterality Date   [place] SECTION  1997;  [place] 03/09[place]   Procedure: DILATATION & CURETTAGE/HYSTEROSCOPY WITH MYOSURE, POLYPECTOMY;  Surgeon: JSalvadore Dom MD;  [place]  Service: Gynecology;  Laterality: N/A;   DILATION AND CURETTAGE OF UTERUS  2018   HAND TENDON SURGERY Right 2014   little finger   SHOULDER ARTHROSCOPY Right [id number]  UMBILICAL HERNIA REPAIR  2016    Current Outpatient Medications  Medication Sig Dispense Refill   Multiple Vitamin (MULTIVITAMIN) tablet Take 1 tablet by mouth daily.      Omega-3 Fatty Acids (FISH OIL) 1000 MG CAPS Take by mouth.     VITAMIN D PO Take 1,000 Units by mouth.     No current facility-administered medications for this visit.    Family History  Problem Relation Age of Onset   Heart disease Father    Cancer Maternal Grandmother    Hypertension Paternal Grandfather    Heart attack Paternal Grandfather     Review of Systems  All other systems reviewed and are negative.  Exam:   LMP  (LMP Unknown)   Weight change: '@WEIGHTCHANGE'$ @ Height:      Ht Readings from Last 3 Encounters:  02/24/20 '5\' 4"'$  (1.626 m)  03/25/19 5' 3.75" (1.619 m)  03/09/19 5' 3.75" (1.619 m)    General appearance: alert, cooperative and appears stated age Head: Normocephalic, without obvious abnormality, atraumatic Neck: no adenopathy, supple, symmetrical, trachea midline and thyroid normal to inspection and palpation Lungs: clear to auscultation bilaterally Cardiovascular: regular rate and rhythm Breasts:  in the upper, inner left breast is a 1-2 cm area of thickening, in area of known cyst (per patient smaller), no other lumps, no skin changes Abdomen: soft, non-tender; non distended,  no masses,  no organomegaly Extremities: extremities normal, atraumatic, no cyanosis or edema Skin: Skin color, texture, turgor normal. No rashes or lesions Lymph nodes:  Cervical, supraclavicular, and axillary nodes normal. No abnormal inguinal nodes palpated Neurologic: Grossly normal   Pelvic: External genitalia:  no lesions              Urethra:  normal appearing urethra with no masses, tenderness or lesions              Bartholins and Skenes: normal                 Vagina: atrophic appearing vagina with normal color and discharge, no lesions              Cervix: no lesions               Bimanual Exam:  Uterus:  normal size, contour, position, consistency, mobility, non-tender              Adnexa: no mass, fullness, tenderness               Rectovaginal: Confirms                Anus:  normal sphincter tone, no lesions    1. Well woman exam Discussed breast self exam Discussed calcium and vit D intake Mammogram due later this month Colonoscopy UTD No pap this year  2. Microadenoma 3 mm on imaging in 6/21 - Prolactin -Will get notes from the Neurosurgeon

## 2021-03-07 ENCOUNTER — Other Ambulatory Visit: Payer: Self-pay | Admitting: Obstetrics and Gynecology

## 2021-03-07 DIAGNOSIS — Z1231 Encounter for screening mammogram for malignant neoplasm of breast: Secondary | ICD-10-CM

## 2021-03-07 LAB — PROLACTIN: Prolactin: 20.8 ng/mL

## 2021-04-07 ENCOUNTER — Other Ambulatory Visit: Payer: Self-pay

## 2021-04-07 ENCOUNTER — Ambulatory Visit
Admission: RE | Admit: 2021-04-07 | Discharge: 2021-04-07 | Disposition: A | Discharge status: Home / Self Care | Payer: Federal, State, Local not specified - PPO | Source: Ambulatory Visit | Attending: Obstetrics and Gynecology | Admitting: Obstetrics and Gynecology

## 2021-04-07 DIAGNOSIS — Z1231 Encounter for screening mammogram for malignant neoplasm of breast: Secondary | ICD-10-CM

## 2021-05-22 DIAGNOSIS — R1012 Left upper quadrant pain: Secondary | ICD-10-CM | POA: Diagnosis not present

## 2022-03-06 NOTE — Progress Notes (Deleted)
56 y.o. [id number] Married White or Caucasian Not Hispanic or Latino female here for annual exam.      No LMP recorded (lmp unknown). Patient is postmenopausal.          Sexually active: {yes no:314532}  The current method of family planning is {contraception:315051}.    Exercising: {yes no:314532}  {types:19826} Smoker:  {YES P5382123  Health Maintenance: Pap:   02/03/19 WNL HR HPV neg  History of abnormal Pap:  no MMG:  04/12/21 Bi-rads 1 neg BMD:   n/a Colonoscopy:  2021 was normal F/U 5 years  per patient  TDaP:  2014 Gardasil: n/a   reports that she has never smoked. She has never used smokeless tobacco. She reports current alcohol use. She reports that she does not use drugs.  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   PONV (postoperative nausea and vomiting)     Past Surgical History:  Procedure Laterality Date   CESAREAN SECTION  1997;  [place] N/A 03/09/2019   Procedure: DILATATION & CURETTAGE/HYSTEROSCOPY WITH MYOSURE, POLYPECTOMY;  Surgeon: Salvadore Dom, MD;  Location: [facility];  Service: Gynecology;  Laterality: N/A;   DILATION AND CURETTAGE OF UTERUS  2018   HAND TENDON SURGERY Right 2014   little finger   SHOULDER ARTHROSCOPY Right 3903   UMBILICAL HERNIA REPAIR  2016    Current Outpatient Medications  Medication Sig Dispense Refill   Multiple Vitamin (MULTIVITAMIN) tablet Take 1 tablet by mouth daily.     Omega-3 Fatty Acids (FISH OIL) 1000 MG CAPS Take by mouth.     VITAMIN D PO Take 1,000 Units by mouth.     No current facility-administered medications for this visit.    Family History  Problem Relation Age of Onset   Heart disease Father    Cancer Maternal Grandmother    Hypertension Paternal Grandfather    Heart attack Paternal  Grandfather     Review of Systems  Exam:   LMP  (LMP Unknown)   Weight change: '@WEIGHTCHANGE'$ @ Height:      Ht Readings from Last 3 Encounters:  03/06/21 '5\' 4"'$  (1.626 m)  02/24/20 '5\' 4"'$  (1.626 m)  03/25/19 5' 3.75" (1.619 m)    General appearance: alert, cooperative and appears stated age Head: Normocephalic, without obvious abnormality, atraumatic Neck: no adenopathy, supple, symmetrical, trachea midline and thyroid {CHL AMB PHY EX THYROID NORM [phone]::"normal to inspection and palpation"} Lungs: clear to auscultation bilaterally Cardiovascular: regular rate and rhythm Breasts: {Exam; breast:13139::"normal appearance, no masses or tenderness"} Abdomen: soft, non-tender; non distended,  no masses,  no organomegaly Extremities: extremities normal, atraumatic, no cyanosis or edema Skin: Skin color, texture, turgor normal. No rashes or lesions Lymph nodes: Cervical, supraclavicular, and axillary nodes normal. No abnormal inguinal nodes palpated Neurologic: Grossly normal   Pelvic: External genitalia:  no lesions              Urethra:  normal appearing urethra with no masses, tenderness or lesions              Bartholins and Skenes: normal                 Vagina: normal appearing vagina with normal color and discharge, no lesions  Cervix: {CHL AMB PHY EX CERVIX NORM DEFAULT:[phone]::"no lesions"}               Bimanual Exam:  Uterus:  {CHL AMB PHY EX UTERUS NORM [phone]::"normal size, contour, position, consistency, mobility, non-tender"}              Adnexa: {CHL AMB PHY EX ADNEXA NO MASS DEFAULT:[phone]::"no mass, fullness, tenderness"}               Rectovaginal: Confirms               Anus:  normal sphincter tone, no lesions  *** chaperoned for the exam.  A:  Well Woman with normal exam  P:

## 2022-03-08 ENCOUNTER — Other Ambulatory Visit: Payer: Self-pay | Admitting: Obstetrics and Gynecology

## 2022-03-08 DIAGNOSIS — Z1231 Encounter for screening mammogram for malignant neoplasm of breast: Secondary | ICD-10-CM

## 2022-03-13 ENCOUNTER — Ambulatory Visit: Payer: Federal, State, Local not specified - PPO | Admitting: Obstetrics and Gynecology

## 2022-04-02 NOTE — Progress Notes (Unsigned)
[place] Phone: [phone] Subjective:   Fontaine No, am serving as a scribe for Dr. Hulan Saas.  I'm seeing this patient by the request  of:  Chrzanowski, Jami B, NP  CC: right knee pain   YFV:[id number]  Bryelle Spiewak is a 56 y.o. female coming in with complaint of R knee pain. Patient states that she has chronic knee pain. Did slip recently and twisted knee which has aggravated her pain. Was told prior to slip that she had Baker's cyst in R knee. Deep knee bending and stairs increase pain. Pain is mostly lateral. Does not limit her walking but knee is very sore afterwards. NSAID prn and uses deep blue rub.       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   PONV (postoperative nausea and vomiting)    Past Surgical History:  Procedure Laterality Date   CESAREAN SECTION  1997;  [place] N/A 03/09/2019   Procedure: DILATATION & CURETTAGE/HYSTEROSCOPY WITH MYOSURE, POLYPECTOMY;  Surgeon: Salvadore Dom, MD;  Location: [facility];  Service: Gynecology;  Laterality: N/A;   DILATION AND CURETTAGE OF UTERUS  2018   HAND TENDON SURGERY Right 2014   little finger   SHOULDER ARTHROSCOPY Right [id number]   UMBILICAL HERNIA REPAIR  2016   Social History   Socioeconomic History   Marital status: Married    Spouse name: Not on file   Number of children: Not on file   Years of education: Not on file   Highest education level: Not on file  Occupational History   Not on file  Tobacco Use   Smoking status: Never   Smokeless tobacco: Never  Vaping Use   Vaping Use: Never used  Substance and Sexual Activity   Alcohol use: Yes    Comment: occasional   Drug use: Never   Sexual activity: Yes     Partners: Male    Birth control/protection: Other-see comments    Comment: husband vasectomy  Other Topics Concern   Not on file  Social History Narrative   Not on file   Social Determinants of Health   Financial Resource Strain: Not on file  Food Insecurity: Not on file  Transportation Needs: Not on file  Physical Activity: Not on file  Stress: Not on file  Social Connections: Not on file   Allergies  Allergen Reactions   Erythromycin Base Other (See Comments)    GI upset   Oxycodone Itching   Family History  Problem Relation Age of Onset   Heart disease Father    Cancer Maternal Grandmother    Hypertension Paternal Grandfather    Heart attack Paternal Grandfather          Current Outpatient Medications (Other):    Multiple Vitamin (MULTIVITAMIN) tablet, Take 1 tablet by mouth daily.   Omega-3 Fatty Acids (FISH OIL) 1000 MG CAPS, Take by mouth.   VITAMIN D PO, Take 1,000 Units by mouth.   Reviewed prior external information including notes and imaging from  primary care provider As well as notes that were available from care everywhere and other healthcare systems.  Past medical history, social, surgical and family history all reviewed in  electronic medical record.  No pertanent information unless stated regarding to the chief complaint.   Review of Systems:  No headache, visual changes, nausea, vomiting, diarrhea, constipation, dizziness, abdominal pain, skin rash, fevers, chills, night sweats, weight loss, swollen lymph nodes, chest pain, shortness of breath, mood changes. POSITIVE muscle aches, body aches, joint swelling  Objective  Blood pressure 122/78, pulse 67, height '5\' 4"'$  (1.626 m), weight 150 lb (68 kg), SpO2 98 %.   General: No apparent distress alert and oriented x3 mood and affect normal, dressed appropriately.  HEENT: Pupils equal, extraocular movements intact  Respiratory: Patient's speak in full sentences and does not appear short of breath   Cardiovascular: No lower extremity edema, non tender, no erythema  Right knee exam seems to have a trace effusion noted.  Patient does have some tenderness to palpation over the patellofemoral area.  Patient does have crepitus noted with lateral tracking of the patella.  No instability with valgus and varus force noted.  Limited muscular skeletal ultrasound was performed and interpreted by Hulan Saas, M   Right knee does have hypoechoic changes with what appears to be some loose bodies noted in the patellofemoral joint.  Does seem to move independently with small calcific changes.  Patient has no significant findings of the medial or lateral malleolus noted.  No significant Baker's cyst noted in the posterior aspect. 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    Impression and Recommendations:    The above documentation has been reviewed and is accurate and complete Lyndal Pulley, DO

## 2022-04-03 ENCOUNTER — Encounter: Payer: Self-pay | Admitting: Family Medicine

## 2022-04-03 ENCOUNTER — Ambulatory Visit (INDEPENDENT_AMBULATORY_CARE_PROVIDER_SITE_OTHER): Payer: Federal, State, Local not specified - PPO

## 2022-04-03 ENCOUNTER — Ambulatory Visit: Payer: Self-pay

## 2022-04-03 ENCOUNTER — Ambulatory Visit: Payer: Federal, State, Local not specified - PPO | Admitting: Family Medicine

## 2022-04-03 VITALS — BP 122/78 | HR 67 | Ht 64.0 in | Wt 150.0 lb

## 2022-04-03 DIAGNOSIS — M2550 Pain in unspecified joint: Secondary | ICD-10-CM

## 2022-04-03 DIAGNOSIS — M25561 Pain in right knee: Secondary | ICD-10-CM | POA: Diagnosis not present

## 2022-04-03 DIAGNOSIS — M130 Polyarthritis, unspecified: Secondary | ICD-10-CM | POA: Diagnosis not present

## 2022-04-03 LAB — FERRITIN: Ferritin: 22.6 ng/mL (ref 10.0–291.0)

## 2022-04-03 LAB — SEDIMENTATION RATE: Sed Rate: 20 mm/hr (ref 0–30)

## 2022-04-03 LAB — IBC PANEL
Iron: 157 ug/dL — ABNORMAL HIGH (ref 42–145)
Saturation Ratios: 39.1 % (ref 20.0–50.0)
TIBC: 401.8 ug/dL (ref 250.0–450.0)
Transferrin: 287 mg/dL (ref 212.0–360.0)

## 2022-04-03 NOTE — Assessment & Plan Note (Signed)
Patellofemoral arthritis.  Patient does have some loose bodies noted.  Encourage patient to take 2000 of vitamin D.  Patient given home exercises to work with Product/process development scientist.  We will get x-rays and some laboratory work-up with patient having calcific changes noted in the knee.  Could be potentially loose bodies that could be a calcific synovitis.  Patient has had a prolactin elevation secondary to pituitary microadenoma previously.  No recent worsening headaches but I do feel like because its been greater than a year it is worth checking again.

## 2022-04-03 NOTE — Patient Instructions (Addendum)
R PF exercises Xray today Tru pull lite Labwork today See me in 4-6 weeks

## 2022-04-04 LAB — VITAMIN D 25 HYDROXY (VIT D DEFICIENCY, FRACTURES): VITD: 34.12 ng/mL (ref 30.00–100.00)

## 2022-04-04 LAB — VITAMIN B12: Vitamin B-12: 472 pg/mL (ref 211–911)

## 2022-04-05 LAB — PTH, INTACT AND CALCIUM
Calcium: 10 mg/dL (ref 8.6–10.4)
PTH: 9 pg/mL — ABNORMAL LOW (ref 16–77)

## 2022-04-05 LAB — ANTI-NUCLEAR AB-TITER (ANA TITER): ANA Titer 1: 1:40 {titer} — ABNORMAL HIGH

## 2022-04-05 LAB — PROLACTIN: Prolactin: 18.4 ng/mL

## 2022-04-05 LAB — ANA: Anti Nuclear Antibody (ANA): POSITIVE — AB

## 2022-04-06 DIAGNOSIS — Z6825 Body mass index (BMI) 25.0-25.9, adult: Secondary | ICD-10-CM | POA: Diagnosis not present

## 2022-04-06 DIAGNOSIS — R030 Elevated blood-pressure reading, without diagnosis of hypertension: Secondary | ICD-10-CM | POA: Diagnosis not present

## 2022-04-06 DIAGNOSIS — J029 Acute pharyngitis, unspecified: Secondary | ICD-10-CM | POA: Diagnosis not present

## 2022-04-09 ENCOUNTER — Encounter: Payer: Self-pay | Admitting: Family Medicine

## 2022-04-09 ENCOUNTER — Ambulatory Visit
Admission: RE | Admit: 2022-04-09 | Discharge: 2022-04-09 | Disposition: A | Discharge status: Home / Self Care | Payer: Federal, State, Local not specified - PPO | Source: Ambulatory Visit | Attending: Obstetrics and Gynecology | Admitting: Obstetrics and Gynecology

## 2022-04-09 DIAGNOSIS — Z1231 Encounter for screening mammogram for malignant neoplasm of breast: Secondary | ICD-10-CM

## 2022-04-11 ENCOUNTER — Encounter: Payer: Self-pay | Admitting: Family Medicine

## 2022-04-11 NOTE — Progress Notes (Signed)
56 y.o. [id number] Married White or Caucasian Not Hispanic or Latino female here for annual exam.  No vaginal bleeding. Sexually active, no pain. No bowel or bladder c/o.  Husband has had a neuroendocrine tumor, had mets to his liver. He has had multiple surgeries to remove tumors, gives his a better quality of life and prolongation of life. Slow growing. Feeling well.   She would like to discuss recent labs done that were done by Dr Tamala Julian in [place]. She had a low + ANA, Dr Tamala Julian felt it was a false +. She is having right knee pain and some calcium deposits in her knee. Wearing a brace.   Diagnosed with pituitary microadenoma in 2020, repeat MRI in 6/21 with decreased size of pituitary gland, 24m microadenoma seen. Recent prolactin level was 18.4    No LMP recorded (lmp unknown). Patient is postmenopausal.          Sexually active: Yes.    The current method of family planning is post menopausal status.    Exercising: Yes.     Walking  Smoker:  no  Health Maintenance: Pap:  02/03/19 WNL HR HPV neg  History of abnormal Pap:  no MMG:  04/09/22 Bi-rads 1 neg  BMD:   none  Colonoscopy: summer of 2021 was normal F/U 5 years  per patient  TDaP:  2014  Gardasil: na      reports that she has never smoked. She has never used smokeless tobacco. She reports current alcohol use. She reports that she does not use drugs. 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 Husband is retired. 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     Past Medical History:  Diagnosis Date   Abnormal uterine bleeding (AUB)    Arthritis    neck   Fibroid    History of asthma    child   Nocturia    [place]   PONV (postoperative nausea and vomiting)     Past Surgical History:  Procedure Laterality Date   CESAREAN SECTION  1997;  [place] 03/[place]    Procedure: DILATATION & CURETTAGE/HYSTEROSCOPY WITH MYOSURE, POLYPECTOMY;  Surgeon: JSalvadore Dom MD;  [facility]  Service: Gynecology;  Laterality: N/A;   DILATION AND CURETTAGE OF UTERUS  2018   HAND TENDON SURGERY Right 2014   little finger   SHOULDER ARTHROSCOPY Right [id number]  UMBILICAL HERNIA REPAIR  2016    Current Outpatient Medications  Medication Sig Dispense Refill   Multiple Vitamin (MULTIVITAMIN) tablet Take 1 tablet by mouth daily.     Omega-3 Fatty Acids (FISH OIL) 1000 MG CAPS Take by mouth.     VITAMIN D PO Take 1,000 Units by mouth.     No current facility-administered medications for this visit.    Family History  Problem Relation Age of Onset   Heart disease Father    Cancer Maternal Grandmother    Hypertension Paternal Grandfather    Heart attack Paternal Grandfather     Review of Systems  All other systems reviewed and are negative.   Exam:   BP 122/74   Pulse 66   Ht '5\' 4"'$  (1.626 m)   Wt 150 lb (68 kg)   LMP  (LMP Unknown)   SpO2 100%   BMI 25.75 kg/m   Weight change: '@WEIGHTCHANGE'$ @ Height:  Height: '5\' 4"'$  (162.6 cm)  Ht Readings from Last 3 Encounters:  04/18/22 '5\' 4"'$  (1.626 m)  04/03/22 '5\' 4"'$  (1.626 m)  03/06/21 '5\' 4"'$  (1.626 m)    General appearance: alert, cooperative and appears stated age Head: Normocephalic, without obvious abnormality, atraumatic Neck: no adenopathy, supple, symmetrical, trachea midline and thyroid normal to inspection and palpation Lungs: clear to auscultation bilaterally Cardiovascular: regular rate and rhythm Breasts: normal appearance, no masses or tenderness Abdomen: soft, non-tender; non distended,  no masses,  no organomegaly Extremities: extremities normal, atraumatic, no cyanosis or edema Skin: Skin color, texture, turgor normal. No rashes or lesions Lymph nodes: Cervical, supraclavicular, and axillary nodes normal. No abnormal inguinal nodes palpated Neurologic: Grossly  normal   Pelvic: External genitalia:  no lesions              Urethra:  normal appearing urethra with no masses, tenderness or lesions              Bartholins and Skenes: normal                 Vagina: atrophic appearing vagina with normal color and discharge, no lesions              Cervix: no lesions               Bimanual Exam:  Uterus:  normal size, contour, position, consistency, mobility, non-tender              Adnexa: no mass, fullness, tenderness               Rectovaginal: Confirms               Anus:  normal sphincter tone, no lesions  Gae Dry chaperoned for the exam.  1. Well woman exam Discussed breast self exam Discussed calcium and vit D intake Mammogram and colonoscopy are UTD  2. Microadenoma Recently prolactin was normal  3. Elevated lipids Not fasting - Lipid panel

## 2022-04-18 ENCOUNTER — Ambulatory Visit (INDEPENDENT_AMBULATORY_CARE_PROVIDER_SITE_OTHER): Payer: Federal, State, Local not specified - PPO | Admitting: Obstetrics and Gynecology

## 2022-04-18 ENCOUNTER — Encounter: Payer: Self-pay | Admitting: Obstetrics and Gynecology

## 2022-04-18 VITALS — BP 122/74 | HR 66 | Ht 64.0 in | Wt 150.0 lb

## 2022-04-18 DIAGNOSIS — Z01419 Encounter for gynecological examination (general) (routine) without abnormal findings: Secondary | ICD-10-CM

## 2022-04-18 DIAGNOSIS — D369 Benign neoplasm, unspecified site: Secondary | ICD-10-CM | POA: Diagnosis not present

## 2022-04-18 DIAGNOSIS — E785 Hyperlipidemia, unspecified: Secondary | ICD-10-CM | POA: Diagnosis not present

## 2022-04-18 NOTE — Patient Instructions (Signed)

## 2022-04-19 LAB — LIPID PANEL
Cholesterol: 263 mg/dL — ABNORMAL HIGH (ref <200)
HDL: 69 mg/dL (ref >=50)
LDL Cholesterol (Calc): 164 mg/dL (calc) — ABNORMAL HIGH
Non-HDL Cholesterol (Calc): 194 mg/dL (calc) — ABNORMAL HIGH (ref <130)
Total CHOL/HDL Ratio: 3.8 (calc) (ref <5.0)
Triglycerides: 152 mg/dL — ABNORMAL HIGH (ref <150)

## 2022-04-27 DIAGNOSIS — E782 Mixed hyperlipidemia: Secondary | ICD-10-CM | POA: Diagnosis not present

## 2022-05-10 NOTE — Progress Notes (Signed)
[place] Phone: [phone] Subjective:    I'm seeing this patient by the request  of:  System, Provider Not In  CC: Knee pain follow-up  [id number]  04/03/2022 Patellofemoral arthritis.  Patient does have some loose bodies noted.  Encourage patient to take 2000 of vitamin D.  Patient given home exercises to work with Product/process development scientist.  We will get x-rays and some laboratory work-up with patient having calcific changes noted in the knee.  Could be potentially loose bodies that could be a calcific synovitis.  Patient has had a prolactin elevation secondary to pituitary microadenoma previously.  No recent worsening headaches but I do feel like because its been greater than a year it is worth checking again.    Update 05/14/2022 Emily Jarvis is a 56 y.o. female coming in with complaint of R knee pain.  Found to have some potential loose bodies noted.  Patient states that she is much better, stop the multi vitamin , she does have a few times where she has intermittent pain, minor pain lateral leg , doesn't wear her brace anymore it causes pain after wearing it    X-rays of patient's knee were independently visualized by me showing the patient did have mild arthritic changes noted with a trace of a joint effusion noted.    Past Medical History:  Diagnosis Date   Abnormal uterine bleeding (AUB)    Arthritis    neck   Fibroid    History of asthma    child   Nocturia    Pituitary microadenoma ([place]) 05/19/2019   PONV (postoperative nausea and vomiting)    Past Surgical History:  Procedure Laterality Date   CESAREAN SECTION  1997;  [place] N/A 03/09/2019   Procedure: DILATATION & CURETTAGE/HYSTEROSCOPY WITH MYOSURE, POLYPECTOMY;  Surgeon: Salvadore Dom, MD;  Location: [facility];  Service: Gynecology;  Laterality: N/A;   DILATION AND  CURETTAGE OF UTERUS  2018   HAND TENDON SURGERY Right 2014   little finger   SHOULDER ARTHROSCOPY Right 3149   UMBILICAL HERNIA REPAIR  2016   Social History   Socioeconomic History   Marital status: Married    Spouse name: Not on file   Number of children: Not on file   Years of education: Not on file   Highest education level: Not on file  Occupational History   Not on file  Tobacco Use   Smoking status: Never   Smokeless tobacco: Never  Vaping Use   Vaping Use: Never used  Substance and Sexual Activity   Alcohol use: Yes    Comment: occasional   Drug use: Never   Sexual activity: Yes    Partners: Male    Birth control/protection: Other-see comments    Comment: husband vasectomy  Other Topics Concern   Not on file  Social History Narrative   Not on file   Social Determinants of Health   Financial Resource Strain: Not on file  Food Insecurity: Not on file  Transportation Needs: Not on file  Physical Activity: Not on file  Stress: Not on file  Social Connections: Not on file   Allergies  Allergen Reactions   Erythromycin Base Other (See Comments)    GI upset   Oxycodone Itching   Family History  Problem Relation Age of Onset   Heart disease Father    Cancer Maternal Grandmother  Hypertension Paternal Grandfather    Heart attack Paternal Grandfather          Current Outpatient Medications (Other):    Omega-3 Fatty Acids (FISH OIL) 1000 MG CAPS, Take by mouth.   VITAMIN D PO, Take 1,000 Units by mouth.   Multiple Vitamin (MULTIVITAMIN) tablet, Take 1 tablet by mouth daily.     Objective  Blood pressure 132/84, pulse 73, height '5\' 4"'$  (1.626 m), weight 150 lb (68 kg), SpO2 98 %.   General: No apparent distress alert and oriented x3 mood and affect normal, dressed appropriately.  HEENT: Pupils equal, extraocular movements intact  Respiratory: Patient's speak in full sentences and does not appear short of breath  Cardiovascular: No lower  extremity edema, non tender, no erythema  Right knee exam shows patient does have good range of motion noted.  No crepitus noted.  No significant instability noted.  effusion down from previous exam.      Impression and Recommendations:    The above documentation has been reviewed and is accurate and complete Lyndal Pulley, DO

## 2022-05-14 ENCOUNTER — Ambulatory Visit: Payer: Federal, State, Local not specified - PPO | Admitting: Family Medicine

## 2022-05-14 VITALS — BP 132/84 | HR 73 | Ht 64.0 in | Wt 150.0 lb

## 2022-05-14 DIAGNOSIS — M25561 Pain in right knee: Secondary | ICD-10-CM

## 2022-05-14 NOTE — Assessment & Plan Note (Signed)
Patient is feeling significantly better at this time.  The patient does not have any new stopping her activities at the moment.  Have a follow-up in 2 months just in case otherwise can follow-up as needed.

## 2022-05-14 NOTE — Patient Instructions (Signed)
Knee looks good See me in 2 months

## 2022-05-18 ENCOUNTER — Emergency Department (HOSPITAL_COMMUNITY): Payer: Federal, State, Local not specified - PPO

## 2022-05-18 ENCOUNTER — Emergency Department (HOSPITAL_COMMUNITY)
Admission: EM | Admit: 2022-05-18 | Discharge: 2022-05-18 | Disposition: A | Discharge status: Home / Self Care | Payer: Federal, State, Local not specified - PPO | Attending: Emergency Medicine | Admitting: Emergency Medicine

## 2022-05-18 ENCOUNTER — Other Ambulatory Visit: Payer: Self-pay

## 2022-05-18 DIAGNOSIS — M545 Low back pain, unspecified: Secondary | ICD-10-CM | POA: Diagnosis not present

## 2022-05-18 DIAGNOSIS — J45909 Unspecified asthma, uncomplicated: Secondary | ICD-10-CM | POA: Diagnosis not present

## 2022-05-18 DIAGNOSIS — R0789 Other chest pain: Secondary | ICD-10-CM | POA: Diagnosis not present

## 2022-05-18 DIAGNOSIS — R61 Generalized hyperhidrosis: Secondary | ICD-10-CM | POA: Insufficient documentation

## 2022-05-18 DIAGNOSIS — R079 Chest pain, unspecified: Secondary | ICD-10-CM | POA: Diagnosis not present

## 2022-05-18 LAB — BASIC METABOLIC PANEL
Anion gap: 5 (ref 5–15)
BUN: 19 mg/dL (ref 6–20)
CO2: 28 mmol/L (ref 22–32)
Calcium: 9.1 mg/dL (ref 8.9–10.3)
Chloride: 107 mmol/L (ref 98–111)
Creatinine, Ser: 0.88 mg/dL (ref 0.44–1.00)
GFR, Estimated: >60 mL/min (ref >60)
Glucose, Bld: 108 mg/dL — ABNORMAL HIGH (ref 70–99)
Potassium: 4 mmol/L (ref 3.5–5.1)
Sodium: 140 mmol/L (ref 135–145)

## 2022-05-18 LAB — CBC
HCT: 42 % (ref 36.0–46.0)
Hemoglobin: 13.5 g/dL (ref 12.0–15.0)
MCH: 30.2 pg (ref 26.0–34.0)
MCHC: 32.1 g/dL (ref 30.0–36.0)
MCV: 94 fL (ref 80.0–100.0)
Platelets: 226 10*3/uL (ref 150–400)
RBC: 4.47 MIL/uL (ref 3.87–5.11)
RDW: 12.3 % (ref 11.5–15.5)
WBC: 6.4 10*3/uL (ref 4.0–10.5)
nRBC: 0 % (ref 0.0–0.2)

## 2022-05-18 LAB — TROPONIN I (HIGH SENSITIVITY)
Troponin I (High Sensitivity): 4 ng/L (ref <18)
Troponin I (High Sensitivity): 4 ng/L (ref <18)

## 2022-05-18 LAB — HEPATIC FUNCTION PANEL
ALT: 19 U/L (ref 0–44)
AST: 21 U/L (ref 15–41)
Albumin: 4.3 g/dL (ref 3.5–5.0)
Alkaline Phosphatase: 59 U/L (ref 38–126)
Bilirubin, Direct: <0.1 mg/dL (ref 0.0–0.2)
Total Bilirubin: 0.5 mg/dL (ref 0.3–1.2)
Total Protein: 7 g/dL (ref 6.5–8.1)

## 2022-05-18 LAB — LIPASE, BLOOD: Lipase: 44 U/L (ref 11–51)

## 2022-05-18 NOTE — ED Provider Notes (Signed)
[facility] DEPT Provider Note   CSN: [id number] Arrival date & time: 05/18/22  0441     History  Chief Complaint  Patient presents with   Chest Pain    Emily Jarvis is a 56 y.o. female.  With past medical history of pituitary adenoma, asthma, arthritis who presents to the emergency department with chest pain.  States that she was woken up this morning around 4am with left sided chest pain and left sided back pain. She describes it as a consistent tightness. She had associated sweaty palms. States that the pain has subsided but continues to have mild tightness particularly in her shoulder blades. She had no associated jaw or arm pain. Denies nausea, palpitations, shortness of breath. No recent cough or fevers. Non-smoker. States she had mildly elevated cholesterol but not enough to put her on medications. No history of chest pain.    Chest Pain Associated symptoms: diaphoresis   Associated symptoms: no cough, no nausea, no palpitations and no shortness of breath        Home Medications Prior to Admission medications   Medication Sig Start Date End Date Taking? Authorizing Provider  Omega-3 Fatty Acids (FISH OIL) 1000 MG CAPS Take by mouth.    [provider]  VITAMIN D PO Take 1,000 Units by mouth.    [provider]      Allergies    Erythromycin base and Oxycodone    Review of Systems   Review of Systems  Constitutional:  Positive for diaphoresis.  Respiratory:  Negative for cough and shortness of breath.   Cardiovascular:  Positive for chest pain. Negative for palpitations.  Gastrointestinal:  Negative for nausea.  All other systems reviewed and are negative.   Physical Exam Updated Vital Signs BP 115/65   Pulse (!) 58   Temp 98.1 F (36.7 C) (Oral)   Resp 12   LMP  (LMP Unknown)   SpO2 97%  Physical Exam Vitals and nursing note reviewed.  Constitutional:      General: She is not in acute distress.     Appearance: Normal appearance. She is well-developed and normal weight. She is not ill-appearing or toxic-appearing.  HENT:     Head: Normocephalic.     Nose: Nose normal.     Mouth/Throat:     Mouth: Mucous membranes are moist.     Pharynx: Oropharynx is clear.  Eyes:     General: No scleral icterus.    Extraocular Movements: Extraocular movements intact.  Neck:     Vascular: No JVD.  Cardiovascular:     Rate and Rhythm: Normal rate and regular rhythm.     Pulses:          Radial pulses are 2+ on the right side and 2+ on the left side.     Heart sounds: Normal heart sounds. No murmur heard. Pulmonary:     Effort: Pulmonary effort is normal. No tachypnea or respiratory distress.     Breath sounds: Normal breath sounds.  Chest:     Chest wall: No tenderness.  Abdominal:     General: Bowel sounds are normal.     Palpations: Abdomen is soft.  Musculoskeletal:        General: Normal range of motion.     Cervical back: Neck supple.     Right lower leg: No edema.     Left lower leg: No edema.  Skin:    General: Skin is warm and dry.  Capillary Refill: Capillary refill takes less than 2 seconds.  Neurological:     General: No focal deficit present.     Mental Status: She is alert and oriented to person, place, and time. Mental status is at baseline.  Psychiatric:        Mood and Affect: Mood normal.        Behavior: Behavior normal.        Thought Content: Thought content normal.        Judgment: Judgment normal.     ED Results / Procedures / Treatments   Labs (all labs ordered are listed, but only abnormal results are displayed) Labs Reviewed  BASIC METABOLIC PANEL - Abnormal; Notable for the following components:      Result Value   Glucose, Bld 108 (*)    All other components within normal limits  CBC  LIPASE, BLOOD  HEPATIC FUNCTION PANEL  TROPONIN I (HIGH SENSITIVITY)  TROPONIN I (HIGH SENSITIVITY)    EKG EKG Interpretation  Date/Time:  Friday May 18 2022 05:06:18 EST Ventricular Rate:  62 PR Interval:  151 QRS Duration: 92 QT Interval:  424 QTC Calculation: 431 R Axis:   66 Text Interpretation: Sinus rhythm RSR' in V1 or V2, right VCD or RVH Confirmed by Randal Buba, April ([id number]) on 05/18/2022 5:28:17 AM  Radiology DG Chest 2 View  Result Date: 05/18/2022 CLINICAL DATA:  56 year old female with sudden onset mid chest pain, woke her from sleep. EXAM: CHEST - 2 VIEW COMPARISON:  None Available. FINDINGS: Normal lung volumes and mediastinal contours. Visualized tracheal air column is within normal limits. Both lungs are clear. No pneumothorax or pleural effusion. Negative visible bowel gas and osseous structures. IMPRESSION: Negative.  No cardiopulmonary abnormality. Electronically Signed   By: Genevie Ann M.D.   On: 05/18/2022 05:38    Procedures Procedures    Medications Ordered in ED Medications - No data to display  ED Course/ Medical Decision Making/ A&P           HEART Score: 3                Medical Decision Making Amount and/or Complexity of Data Reviewed Labs: ordered. Radiology: ordered.  Initial Impression and Ddx 56 year old well-appearing, nonseptic, nontoxic appearing female.  Complaining of left-sided and mid back pain that was associated with sweatiness this morning.  Nonexertional.  She is still having some mild symptoms. Will need cardiac workup including EKG, labs, chest x-ray. Initial differential includes acute chest syndrome, stable angina, atypical angina, pulmonary embolism, pneumothorax, aortic dissection, pleural effusion, CHF, COPD, asthma, myocarditis, pericarditis, cardiac tamponade, chest wall pain  Patient PMH that increases complexity of ED encounter: Pituitary adenoma, asthma  Interpretation of Diagnostics I independent reviewed and interpreted the labs as followed: Troponin x 2 negative, BMP, CBC, LFTs, lipase all within normal limits  - I independently visualized the following imaging with  scope of interpretation limited to determining acute life threatening conditions related to emergency care: Chest x-ray, which revealed no acute findings  Patient Reassessment and Ultimate Disposition/Management On my reassessment the patient continues to be stable, nonseptic, nontoxic in appearance.  She has very mild residual left-sided chest tightness.  It is not reproducible. EKG without ischemia or infarction, troponin x 2 negative, doubt ACS at this time. No history of CHF and peers euvolemic on exam.  Did not obtain BNP.  Doubt CHF exacerbation. Chest x-ray without pneumothorax, pneumonia, pleural effusion. No history of asthma and not wheezing on exam. No recent  illnesses concerning for myocarditis, pericarditis. Symptoms are also inconsistent with cardiac tamponade or aortic dissection.  Symptoms are also inconsistent with PE.  Cannot PERC out due to age but otherwise is Wells low risk and will not pursue D-dimer or PE study. HEART Score: 3 Heart score is low.  She has atypical chest pain features.  Feel that she is appropriate for follow-up with her primary care provider.  If she has return of symptoms, worsening symptoms she knows to return.  Otherwise feel that she is safe for discharge at this time.  Patient management required discussion with the following services or consulting groups:  None  Complexity of Problems Addressed Acute complicated illness or Injury  Additional Data Reviewed and Analyzed Further history obtained from: Recent PCP notes  Patient Encounter Risk Assessment None  Final Clinical Impression(s) / ED Diagnoses Final diagnoses:  Nonspecific chest pain    Rx / DC Orders ED Discharge Orders     None         Mickie Hillier, PA-C 05/18/22 [place], Ankit, MD 05/19/22 1534

## 2022-05-18 NOTE — ED Provider Triage Note (Signed)
Emergency Medicine Provider Triage Evaluation Note  Stephany Poorman , a 56 y.o. female  was evaluated in triage.  Pt complains of left sided chest pain that started suddenly at about 0300. It woke her up from sleep. She states that she felt a squeezing/pressure like pain underneath her left breast and wrapping into her mid sternal area. It also radiated into her back. The pain seems to wax and wane, but has improved somewhat from when it started. She has no associated symptoms.  Review of Systems  Positive:  Negative:   Physical Exam  BP (!) 156/81 (BP Location: Right Arm)   Pulse 63   Temp 98.2 F (36.8 C) (Oral)   Resp 16   LMP  (LMP Unknown)   SpO2 99%  Gen:   Awake, no distress   Resp:  Normal effort  MSK:   Moves extremities without difficulty  Other:  Abdomen soft, nontender. No reproducible chest wall tenderness.   Medical Decision Making  Medically screening exam initiated at 6:12 AM.  Appropriate orders placed.  Dinia Joynt was informed that the remainder of the evaluation will be completed by another provider, this initial triage assessment does not replace that evaluation, and the importance of remaining in the ED until their evaluation is complete.     Adolphus Birchwood, [place] 05/18/22 [phone]

## 2022-05-18 NOTE — ED Triage Notes (Signed)
Patient coming to ED for evaluation of chest pain.  Reports pain started "about 45 minutes ago."  States pain woke her from sleep.  Located in L chest and radiates to back and under L breast.  No cardiac hx.  No SHOB.

## 2022-05-18 NOTE — Discharge Instructions (Signed)
You were seen in the emergency department today for chest pain.  Please follow-up with your primary care provider for recheck of your symptoms.  Please return if you have worsening symptoms.  You may need a referral to cardiology in the future to have workup of your chest pain symptoms.  Please talk with your primary care provider regarding this.

## 2022-05-28 DIAGNOSIS — M549 Dorsalgia, unspecified: Secondary | ICD-10-CM | POA: Diagnosis not present

## 2022-05-28 DIAGNOSIS — R1012 Left upper quadrant pain: Secondary | ICD-10-CM | POA: Diagnosis not present

## 2022-05-29 ENCOUNTER — Other Ambulatory Visit: Payer: Self-pay | Admitting: Family Medicine

## 2022-05-29 DIAGNOSIS — M549 Dorsalgia, unspecified: Secondary | ICD-10-CM

## 2022-05-29 DIAGNOSIS — R1012 Left upper quadrant pain: Secondary | ICD-10-CM

## 2022-06-13 ENCOUNTER — Ambulatory Visit
Admission: RE | Admit: 2022-06-13 | Discharge: 2022-06-13 | Disposition: A | Discharge status: Home / Self Care | Payer: Federal, State, Local not specified - PPO | Source: Ambulatory Visit | Attending: Family Medicine | Admitting: Family Medicine

## 2022-06-13 DIAGNOSIS — M549 Dorsalgia, unspecified: Secondary | ICD-10-CM

## 2022-06-13 DIAGNOSIS — R1012 Left upper quadrant pain: Secondary | ICD-10-CM

## 2022-07-05 ENCOUNTER — Ambulatory Visit
Admission: RE | Admit: 2022-07-05 | Discharge: 2022-07-05 | Disposition: A | Discharge status: Home / Self Care | Payer: Federal, State, Local not specified - PPO | Source: Ambulatory Visit | Attending: Family Medicine | Admitting: Family Medicine

## 2022-07-05 DIAGNOSIS — M549 Dorsalgia, unspecified: Secondary | ICD-10-CM

## 2022-07-05 DIAGNOSIS — R109 Unspecified abdominal pain: Secondary | ICD-10-CM | POA: Diagnosis not present

## 2022-07-05 DIAGNOSIS — R1012 Left upper quadrant pain: Secondary | ICD-10-CM

## 2022-07-12 NOTE — Progress Notes (Deleted)
Emily Jarvis Emily Jarvis Phone: [phone] Subjective:    I'm seeing this patient by the request  of:  System, Provider Not In  CC:   [id number]  05/14/2022 Patient is feeling significantly better at this time.  The patient does not have any new stopping her activities at the moment.  Have a follow-up in 2 months just in case otherwise can follow-up as needed.      Update 07/17/2022 Emily Jarvis is a 57 y.o. female coming in with complaint of R knee pain. 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/2019   PONV (postoperative nausea and vomiting)    Past Surgical History:  Procedure Laterality Date   CESAREAN SECTION  1997;  [place] N/A 03/09/2019   Procedure: DILATATION & CURETTAGE/HYSTEROSCOPY WITH MYOSURE, POLYPECTOMY;  Surgeon: Salvadore Dom, MD;  Location: [facility];  Service: Gynecology;  Laterality: N/A;   DILATION AND CURETTAGE OF UTERUS  2018   HAND TENDON SURGERY Right 2014   little finger   SHOULDER ARTHROSCOPY Right [id number]   UMBILICAL HERNIA REPAIR  2016   Social History   Socioeconomic History   Marital status: Married    Spouse name: Not on file   Number of children: Not on file   Years of education: Not on file   Highest education level: Not on file  Occupational History   Not on file  Tobacco Use   Smoking status: Never   Smokeless tobacco: Never  Vaping Use   Vaping Use: Never used  Substance and Sexual Activity   Alcohol use: Yes    Comment: occasional   Drug use: Never   Sexual activity: Yes    Partners: Male    Birth control/protection: Other-see comments    Comment: husband vasectomy  Other Topics Concern   Not on file  Social History  Narrative   Not on file   Social Determinants of Health   Financial Resource Strain: Not on file  Food Insecurity: Not on file  Transportation Needs: Not on file  Physical Activity: Not on file  Stress: Not on file  Social Connections: Not on file   Allergies  Allergen Reactions   Erythromycin Base Other (See Comments)    GI upset   Oxycodone Itching   Family History  Problem Relation Age of Onset   Heart disease Father    Cancer Maternal Grandmother    Hypertension Paternal Grandfather    Heart attack Paternal Grandfather          Current Outpatient Medications (Other):    Omega-3 Fatty Acids (FISH OIL) 1000 MG CAPS, Take by mouth.   VITAMIN D PO, Take 1,000 Units by mouth.   Reviewed prior external information including notes and imaging from  primary care provider As well as notes that were available from care everywhere and other healthcare systems.  Past medical history, social, surgical and family history all reviewed in electronic medical record.  No pertanent information unless stated regarding to the chief complaint.   Review of Systems:  No headache, visual changes, nausea, vomiting, diarrhea, constipation, dizziness, abdominal pain, skin rash, fevers, chills, night sweats, weight loss, swollen lymph  nodes, body aches, joint swelling, chest pain, shortness of breath, mood changes. POSITIVE muscle aches  Objective  There were no vitals taken for this visit.   General: No apparent distress alert and oriented x3 mood and affect normal, dressed appropriately.  HEENT: Pupils equal, extraocular movements intact  Respiratory: Patient's speak in full sentences and does not appear short of breath  Cardiovascular: No lower extremity edema, non tender, no erythema      Impression and Recommendations:

## 2022-07-17 ENCOUNTER — Ambulatory Visit: Payer: Federal, State, Local not specified - PPO | Admitting: Family Medicine

## 2022-11-11 ENCOUNTER — Encounter: Payer: Self-pay | Admitting: Family Medicine

## 2022-11-14 NOTE — Progress Notes (Unsigned)
    Aleen Sells D.[facility] [address] [place] [address] Phone: [phone]   Assessment and Plan:     There are no diagnoses linked to this encounter.  ***   Pertinent previous records reviewed include ***   Follow Up: ***     Subjective:   I, Sarely Stracener, am serving as a Neurosurgeon for Doctor Richardean Sale  Chief Complaint: neck pain   HPI:   11/15/2022 Patient is a 57 year old female complaining of neck pain. Patient states  Relevant Historical Information: ***  Additional pertinent review of systems negative.   Current Outpatient Medications:    Omega-3 Fatty Acids (FISH OIL) 1000 MG CAPS, Take by mouth., Disp: , Rfl:    VITAMIN D PO, Take 1,000 Units by mouth., Disp: , Rfl:    Objective:     There were no vitals filed for this visit.    There is no height or weight on file to calculate BMI.    Physical Exam:    ***   Electronically signed by:  Aleen Sells [facility] 7:15 AM 11/14/22

## 2022-11-15 ENCOUNTER — Ambulatory Visit (INDEPENDENT_AMBULATORY_CARE_PROVIDER_SITE_OTHER): Payer: Federal, State, Local not specified - PPO

## 2022-11-15 ENCOUNTER — Ambulatory Visit: Payer: Federal, State, Local not specified - PPO | Admitting: Sports Medicine

## 2022-11-15 VITALS — BP 110/80 | HR 61 | Ht 64.0 in | Wt 150.0 lb

## 2022-11-15 DIAGNOSIS — M47812 Spondylosis without myelopathy or radiculopathy, cervical region: Secondary | ICD-10-CM

## 2022-11-15 DIAGNOSIS — M542 Cervicalgia: Secondary | ICD-10-CM | POA: Diagnosis not present

## 2022-11-15 MED ORDER — MELOXICAM 15 MG PO TABS: 15.0000 mg | ORAL_TABLET | Freq: Every day | ORAL | 0 refills | Status: DC

## 2022-11-15 NOTE — Patient Instructions (Addendum)
Good to see you  Neck HEP  - Start meloxicam 15 mg daily x2 weeks.  If still having pain after 2 weeks, complete 3rd-week of meloxicam. May use remaining meloxicam as needed once daily for pain control.  Do not to use additional NSAIDs while taking meloxicam.  May use Tylenol [phone] mg 2 to 3 times a day for breakthrough pain. PT referral  4 week follow up

## 2022-12-02 IMAGING — MG MM DIGITAL SCREENING BILAT W/ TOMO AND CAD
8 series · 8 of 24 positions shown · non-contrast
Comparison: Previous exam(s).

CLINICAL DATA: Screening.

EXAM:
DIGITAL SCREENING BILATERAL MAMMOGRAM WITH TOMOSYNTHESIS AND CAD
TECHNIQUE: Bilateral screening digital craniocaudal and mediolateral oblique
mammograms were obtained. Bilateral screening digital breast
tomosynthesis was performed. The images were evaluated with
computer-aided detection.

[L CC synth-2D]
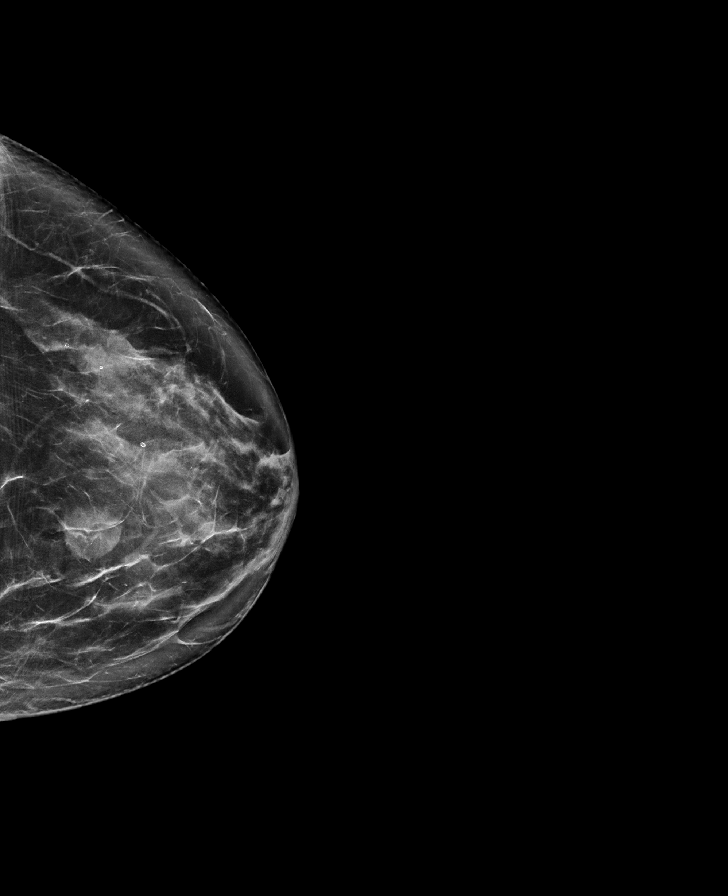

[L MLO synth-2D]
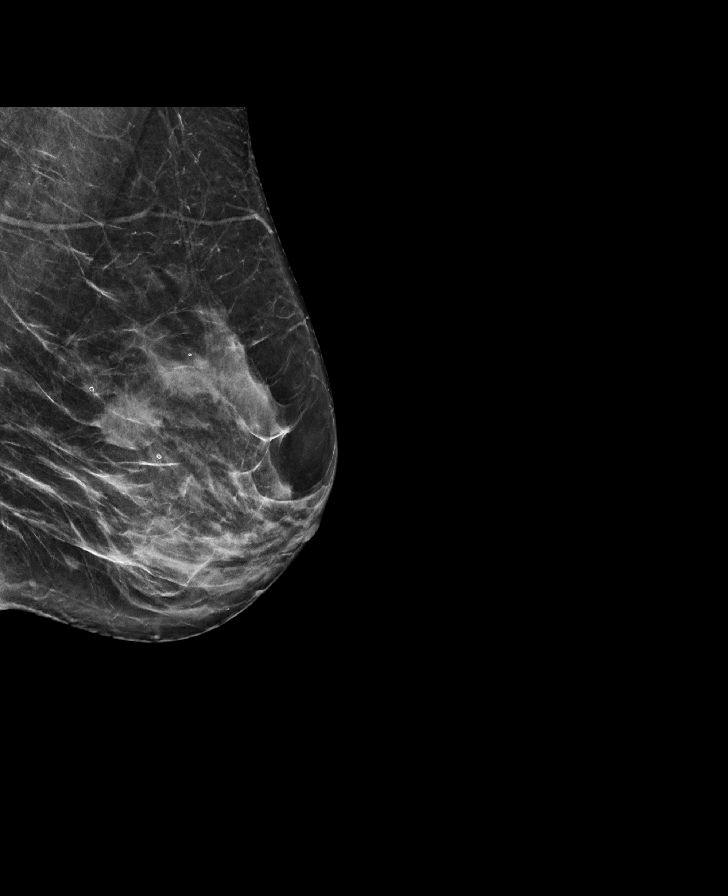

[R MLO synth-2D]
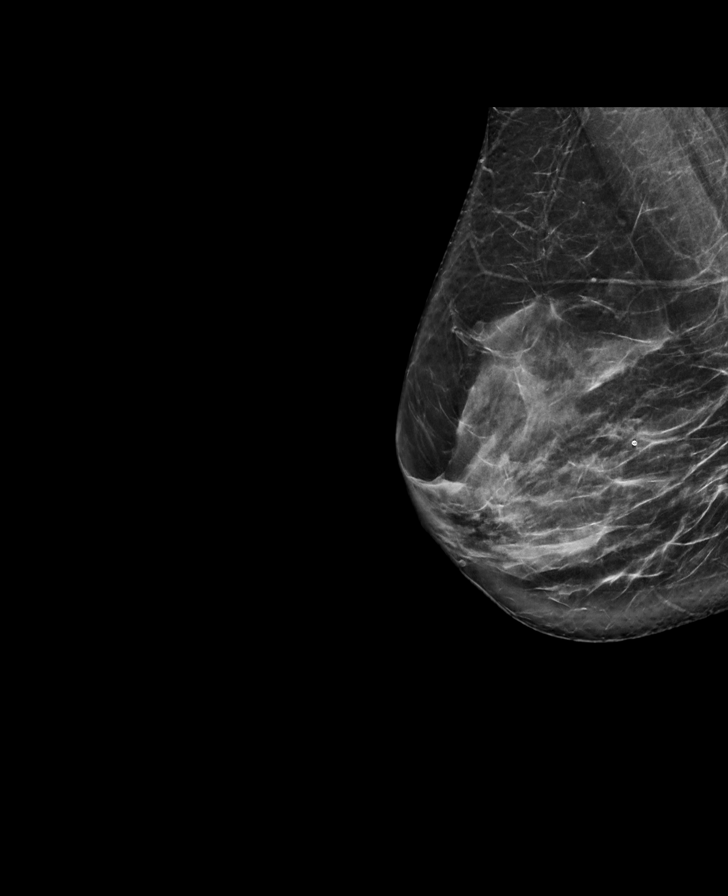

[R CC synth-2D]
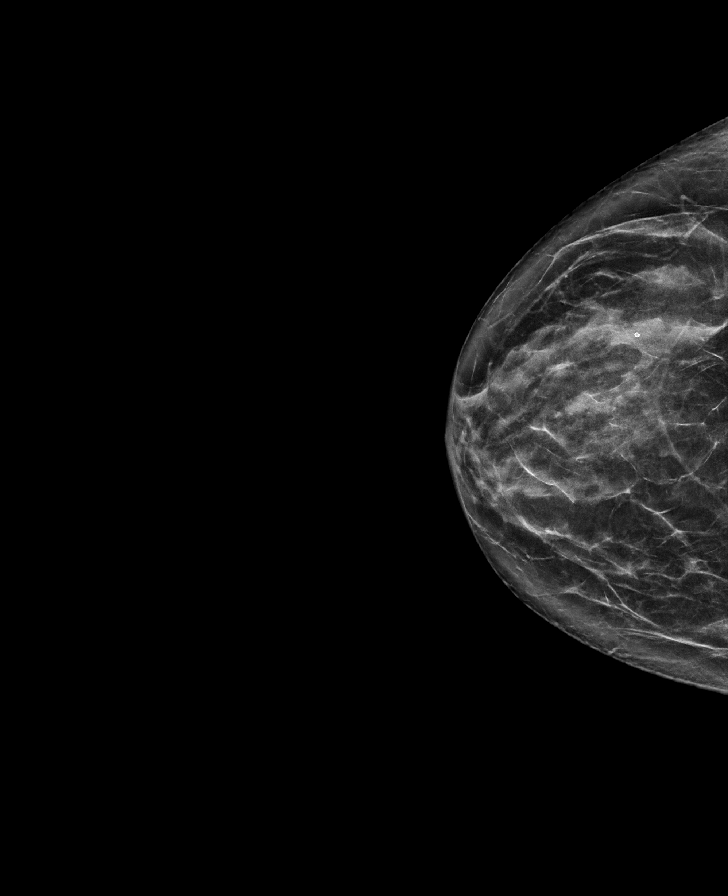

[L MLO tomo · tomo slice 39/76.0]
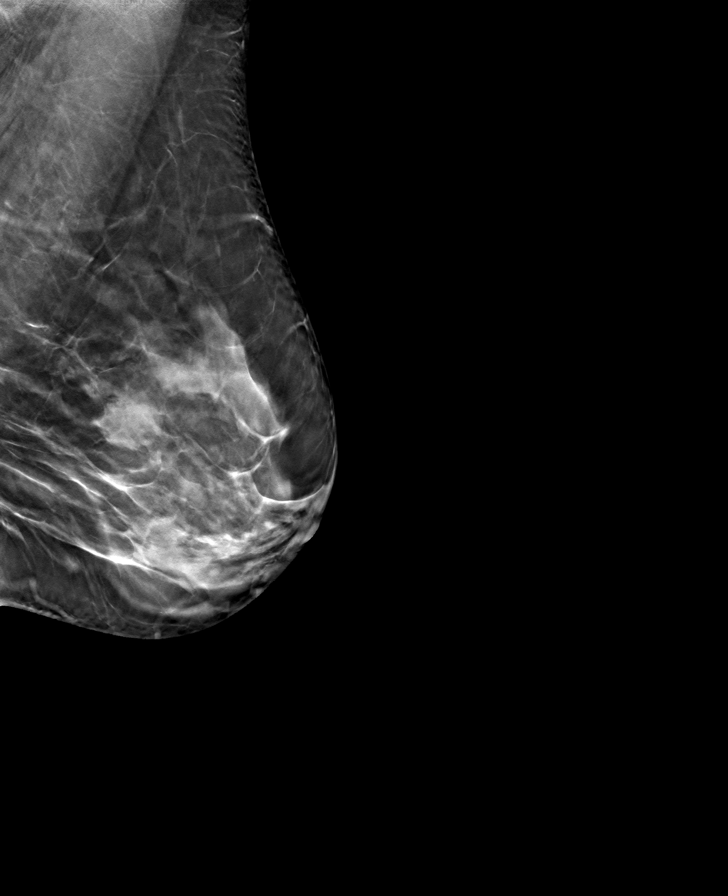

[L CC tomo · tomo slice 40/79.0]
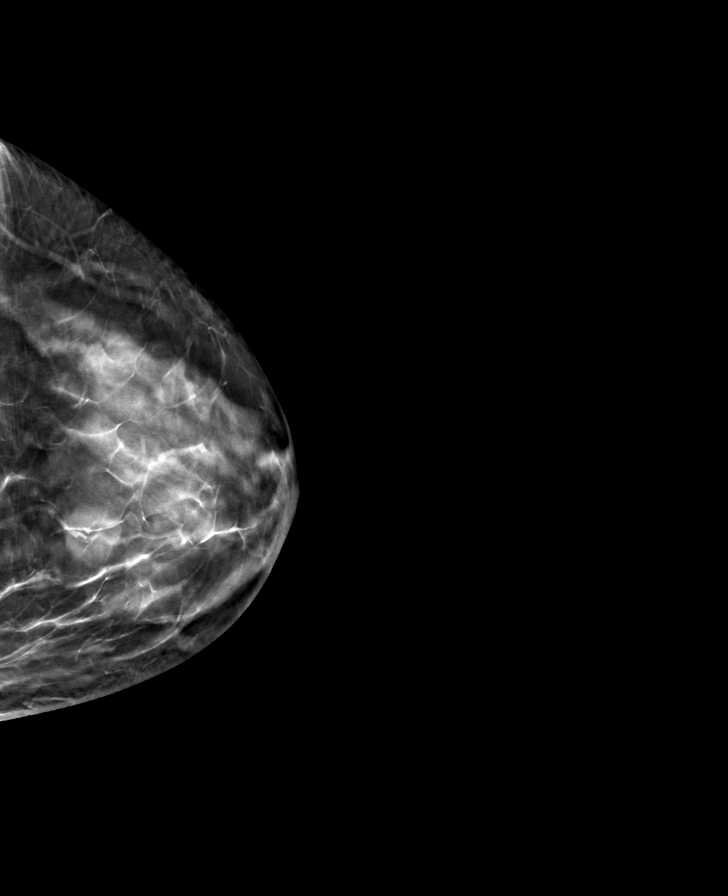

[R CC tomo · tomo slice 36/71.0]
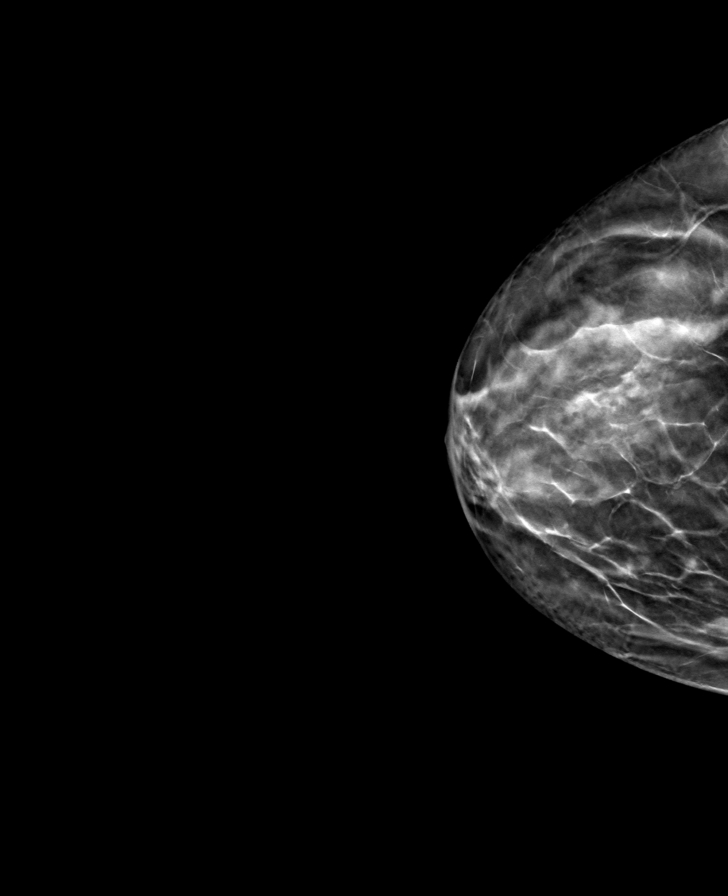

[R MLO tomo · tomo slice 40/79.0]
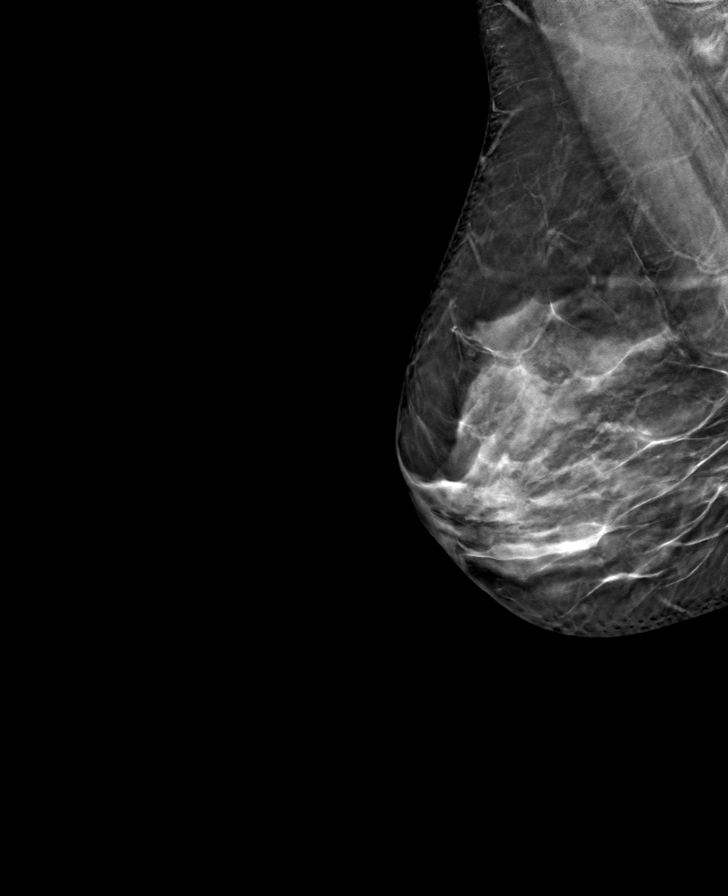

[8 of 24 positions shown; findings below may reference images not displayed]

ACR Breast Density Category c: The breast tissue is heterogeneously
dense, which may obscure small masses.
FINDINGS: There are no findings suspicious for malignancy.
IMPRESSION: No mammographic evidence of malignancy. A result letter of this
screening mammogram will be mailed directly to the patient.

RECOMMENDATION:
Screening mammogram in one year. (Code:Q3-W-BC3)

BI-RADS CATEGORY  1: Negative.

## 2022-12-13 ENCOUNTER — Ambulatory Visit: Payer: Federal, State, Local not specified - PPO | Admitting: Sports Medicine

## 2023-01-14 DIAGNOSIS — L308 Other specified dermatitis: Secondary | ICD-10-CM | POA: Diagnosis not present

## 2023-02-01 DIAGNOSIS — Z0000 Encounter for general adult medical examination without abnormal findings: Secondary | ICD-10-CM | POA: Diagnosis not present

## 2023-02-01 DIAGNOSIS — Z780 Asymptomatic menopausal state: Secondary | ICD-10-CM | POA: Diagnosis not present

## 2023-02-01 DIAGNOSIS — E782 Mixed hyperlipidemia: Secondary | ICD-10-CM | POA: Diagnosis not present

## 2023-02-05 ENCOUNTER — Other Ambulatory Visit: Payer: Self-pay | Admitting: Family Medicine

## 2023-02-05 DIAGNOSIS — Z8269 Family history of other diseases of the musculoskeletal system and connective tissue: Secondary | ICD-10-CM

## 2023-03-08 ENCOUNTER — Other Ambulatory Visit: Payer: Self-pay | Admitting: Nurse Practitioner

## 2023-03-08 DIAGNOSIS — Z1231 Encounter for screening mammogram for malignant neoplasm of breast: Secondary | ICD-10-CM

## 2023-04-16 ENCOUNTER — Ambulatory Visit
Admission: RE | Admit: 2023-04-16 | Discharge: 2023-04-16 | Disposition: A | Discharge status: Home / Self Care | Payer: Federal, State, Local not specified - PPO | Source: Ambulatory Visit | Attending: Nurse Practitioner | Admitting: Nurse Practitioner

## 2023-04-16 DIAGNOSIS — Z1231 Encounter for screening mammogram for malignant neoplasm of breast: Secondary | ICD-10-CM | POA: Diagnosis not present

## 2023-04-22 ENCOUNTER — Other Ambulatory Visit (HOSPITAL_COMMUNITY)
Admission: RE | Admit: 2023-04-22 | Discharge: 2023-04-22 | Disposition: A | Discharge status: Home / Self Care | Payer: Federal, State, Local not specified - PPO | Source: Ambulatory Visit | Attending: Nurse Practitioner | Admitting: Nurse Practitioner

## 2023-04-22 ENCOUNTER — Ambulatory Visit (INDEPENDENT_AMBULATORY_CARE_PROVIDER_SITE_OTHER): Payer: Federal, State, Local not specified - PPO | Admitting: Nurse Practitioner

## 2023-04-22 ENCOUNTER — Encounter: Payer: Self-pay | Admitting: Nurse Practitioner

## 2023-04-22 ENCOUNTER — Ambulatory Visit: Payer: Federal, State, Local not specified - PPO | Admitting: Obstetrics and Gynecology

## 2023-04-22 VITALS — BP 108/64 | HR 61 | Ht 63.78 in | Wt 152.0 lb

## 2023-04-22 DIAGNOSIS — Z124 Encounter for screening for malignant neoplasm of cervix: Secondary | ICD-10-CM | POA: Diagnosis not present

## 2023-04-22 DIAGNOSIS — Z01419 Encounter for gynecological examination (general) (routine) without abnormal findings: Secondary | ICD-10-CM | POA: Diagnosis not present

## 2023-04-22 DIAGNOSIS — Z780 Asymptomatic menopausal state: Secondary | ICD-10-CM

## 2023-04-22 NOTE — Progress Notes (Signed)
   Emily Jarvis 1965-09-03 [id number]   History:  57 y.o. [id number] presents for annual exam. Postmenopausal - no HRT, no bleeding. Normal pap history. Diagnosed with pituitary microadenoma in 2020, repeat MRI in 6/21 with decreased size of pituitary gland, 3mm microadenoma seen.  Gynecologic History No LMP recorded (lmp unknown). Patient is postmenopausal.   Contraception/Family planning: post menopausal status Sexually active: Yes  Health Maintenance Last Pap: 02/03/2019. Results were: Normal neg HPV, 5-year repeat Last mammogram: 04/16/2023. Results were: Normal Last colonoscopy: UTD Last Dexa: Never. Scheduled 08/07/23   Past medical history, past surgical history, family history and social history were all reviewed and documented in the EPIC chart. Married. 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 67 yo daughter, Producer, television/film/video. 88 yo son. They live together in [place].   ROS:  A ROS was performed and pertinent positives and negatives are included.  Exam:  Vitals:   04/22/23 1602  BP: 108/64  Pulse: 61  SpO2: 99%  Weight: 152 lb (68.9 kg)  Height: 5' 3.78" (1.62 m)   Body mass index is 26.27 kg/m.   General appearance:  Normal Thyroid:  Symmetrical, normal in size, without palpable masses or nodularity. Respiratory  Auscultation:  Clear without wheezing or rhonchi Cardiovascular  Auscultation:  Regular rate, without rubs, murmurs or gallops  Edema/varicosities:  Not grossly evident Abdominal  Soft,nontender, without masses, guarding or rebound.  Liver/spleen:  No organomegaly noted  Hernia:  None appreciated  Skin  Inspection:  Grossly normal Breasts: Examined lying and sitting.   Right: Without masses, retractions, nipple discharge or axillary adenopathy.   Left: Without masses, retractions, nipple discharge or axillary adenopathy. Pelvic: External genitalia:  no lesions              Urethra:  normal appearing urethra with no masses, tenderness or lesions               Bartholins and Skenes: normal                 Vagina: normal appearing vagina with normal color and discharge, no lesions. Atrophic changes              Cervix: no lesions Bimanual Exam:  Uterus:  no masses or tenderness              Adnexa: no mass, fullness, tenderness              Rectovaginal: Deferred              Anus:  normal, no lesions   Patient informed chaperone available to be present for breast and pelvic exam. Patient has requested no chaperone to be present. Patient has been advised what will be completed during breast and pelvic exam.   Assessment/Plan:  57 y.o. [name] for annual exam.   Well female exam with routine gynecological exam - Education provided on SBEs, importance of preventative screenings, current guidelines, high calcium diet, regular exercise, and multivitamin daily.  Labs with PCP.   Postmenopausal - no HRT, no bleeding  Cervical cancer screening - Plan: Cytology - PAP( [place]). Normal pap history.   Screening for breast cancer - Normal mammogram history.  Continue annual screenings.  Normal breast exam today.  Screening for colon cancer - UTD. Will repeat at GI's recommended interval.   Screening for osteoporosis - DXA scheduled in February.  Return in about 1 year (around 04/21/2024) for Annual.   Olivia Mackie DNP, 4:32 PM 04/22/2023

## 2023-04-24 LAB — CYTOLOGY - PAP
Comment: NEGATIVE
Diagnosis: NEGATIVE
High risk HPV: NEGATIVE

## 2023-08-07 ENCOUNTER — Ambulatory Visit
Admission: RE | Admit: 2023-08-07 | Discharge: 2023-08-07 | Disposition: A | Discharge status: Home / Self Care | Payer: Federal, State, Local not specified - PPO | Source: Ambulatory Visit | Attending: Family Medicine | Admitting: Family Medicine

## 2023-08-07 DIAGNOSIS — Z8269 Family history of other diseases of the musculoskeletal system and connective tissue: Secondary | ICD-10-CM

## 2023-08-07 DIAGNOSIS — N958 Other specified menopausal and perimenopausal disorders: Secondary | ICD-10-CM | POA: Diagnosis not present

## 2023-08-07 DIAGNOSIS — E2839 Other primary ovarian failure: Secondary | ICD-10-CM | POA: Diagnosis not present

## 2023-08-27 DIAGNOSIS — R1011 Right upper quadrant pain: Secondary | ICD-10-CM | POA: Diagnosis not present

## 2023-08-28 ENCOUNTER — Other Ambulatory Visit: Payer: Self-pay | Admitting: Family Medicine

## 2023-08-28 DIAGNOSIS — R1011 Right upper quadrant pain: Secondary | ICD-10-CM

## 2023-08-29 ENCOUNTER — Ambulatory Visit
Admission: RE | Admit: 2023-08-29 | Discharge: 2023-08-29 | Disposition: A | Discharge status: Home / Self Care | Source: Ambulatory Visit | Attending: Family Medicine | Admitting: Family Medicine

## 2023-08-29 DIAGNOSIS — R1011 Right upper quadrant pain: Secondary | ICD-10-CM | POA: Diagnosis not present

## 2023-08-29 MED ORDER — IOPAMIDOL (ISOVUE-300) INJECTION 61%
100.0000 mL | Freq: Once | INTRAVENOUS | Status: AC | PRN
  Administered 2023-08-29: 100 mL via INTRAVENOUS

## 2023-09-09 ENCOUNTER — Other Ambulatory Visit (HOSPITAL_COMMUNITY): Payer: Self-pay | Admitting: Gastroenterology

## 2023-09-09 DIAGNOSIS — R1013 Epigastric pain: Secondary | ICD-10-CM | POA: Diagnosis not present

## 2023-09-09 DIAGNOSIS — R1011 Right upper quadrant pain: Secondary | ICD-10-CM

## 2023-09-25 ENCOUNTER — Encounter (HOSPITAL_COMMUNITY)
Admission: RE | Admit: 2023-09-25 | Discharge: 2023-09-25 | Disposition: A | Discharge status: Home / Self Care | Source: Ambulatory Visit | Attending: Gastroenterology | Admitting: Gastroenterology

## 2023-09-25 DIAGNOSIS — R1011 Right upper quadrant pain: Secondary | ICD-10-CM | POA: Diagnosis not present

## 2023-09-25 DIAGNOSIS — R1013 Epigastric pain: Secondary | ICD-10-CM | POA: Insufficient documentation

## 2023-09-25 MED ORDER — TECHNETIUM TC 99M MEBROFENIN IV KIT
5.1000 | PACK | Freq: Once | INTRAVENOUS | Status: AC | PRN
  Administered 2023-09-25: 5.1 via INTRAVENOUS

## 2024-02-04 DIAGNOSIS — R635 Abnormal weight gain: Secondary | ICD-10-CM | POA: Diagnosis not present

## 2024-02-04 DIAGNOSIS — Z0000 Encounter for general adult medical examination without abnormal findings: Secondary | ICD-10-CM | POA: Diagnosis not present

## 2024-02-04 DIAGNOSIS — E782 Mixed hyperlipidemia: Secondary | ICD-10-CM | POA: Diagnosis not present

## 2024-04-06 ENCOUNTER — Other Ambulatory Visit: Payer: Self-pay | Admitting: Family Medicine

## 2024-04-06 DIAGNOSIS — Z1231 Encounter for screening mammogram for malignant neoplasm of breast: Secondary | ICD-10-CM

## 2024-04-16 ENCOUNTER — Ambulatory Visit
Admission: RE | Admit: 2024-04-16 | Discharge: 2024-04-16 | Disposition: A | Discharge status: Home / Self Care | Source: Ambulatory Visit | Attending: Family Medicine | Admitting: Family Medicine

## 2024-04-16 DIAGNOSIS — Z1231 Encounter for screening mammogram for malignant neoplasm of breast: Secondary | ICD-10-CM

## 2024-04-22 ENCOUNTER — Ambulatory Visit: Admitting: Nurse Practitioner

## 2024-05-18 DIAGNOSIS — L309 Dermatitis, unspecified: Secondary | ICD-10-CM | POA: Diagnosis not present

## 2024-06-29 ENCOUNTER — Other Ambulatory Visit: Payer: Self-pay

## 2024-06-29 ENCOUNTER — Emergency Department (HOSPITAL_COMMUNITY)
Admission: EM | Admit: 2024-06-29 | Discharge: 2024-06-30 | Discharge status: Against Medical Advice | Attending: Emergency Medicine | Admitting: Emergency Medicine

## 2024-06-29 ENCOUNTER — Encounter (HOSPITAL_COMMUNITY): Payer: Self-pay

## 2024-06-29 DIAGNOSIS — I482 Chronic atrial fibrillation, unspecified: Secondary | ICD-10-CM | POA: Insufficient documentation

## 2024-06-29 DIAGNOSIS — Z5321 Procedure and treatment not carried out due to patient leaving prior to being seen by health care provider: Secondary | ICD-10-CM | POA: Insufficient documentation

## 2024-06-29 DIAGNOSIS — R002 Palpitations: Secondary | ICD-10-CM | POA: Diagnosis present

## 2024-06-29 NOTE — Progress Notes (Signed)
 Emily Jarvis is a 59 y.o.  with  reports that she has never smoked. She has never used smokeless tobacco. with  Active Ambulatory Problems    Diagnosis Date Noted   No Active Ambulatory Problems   Resolved Ambulatory Problems    Diagnosis Date Noted   No Resolved Ambulatory Problems   Past Medical History:  Diagnosis Date   Allergy    who presents today at Urgent Care for  Chief Complaint  Patient presents with   Irregular Heart Beat    Rapid heart rate, elevated b/p @ home ~900pm 200/167 HR 109. Reports episodes of cold shaky hands but it went away on its own  Denies chest pain, arm pain, HA, dizziness, SOB       History of Present Illness The patient is a 59 year old female who presents with palpitations, irregular heart rate, and increased blood pressure.  She reports experiencing another episode of these symptoms approximately a week ago, characterized by elevated blood pressure and a rapid heart rate. During this episode, she noted a heart rate of 109 beats per minute, accompanied by cold, shaky hands. These symptoms resolved spontaneously. She does not report any chest pain, arm pain, headache, dizziness, or shortness of breath.     Patient has no co-morbidities  or medications that might increase the risk for developing a severe infection     reports that she has never smoked. She has never used smokeless tobacco.  Review of Systems  Review of systems is otherwise negative except as noted in the HPI and Assessment/MDM   Physical Exam  BP (!) 176/121 (BP Location: Right arm, Patient Position: Sitting)   Pulse (!) 115   Temp 98.8 F (37.1 C) (Oral)   Resp 16   Ht 1.626 m (5' 4)   Wt 67.1 kg (148 lb)   SpO2 97%   BMI 25.40 kg/m   Constitutional:      General: Patient is not in acute distress.    Appearance: Normal appearance.  Neurological:     General: No focal deficit present.     Mental Status: alert and oriented to person, place, and time.  HENT:      Eyes:     Conjunctiva/sclera: Conjunctivae normal. Pharynx normal    There is no associated anterior cervical lymphadenopathy    Pupils: Pupils are equal, round, and reactive to light.     TM's normal with no visible effusion  Cardiovascular:     Heart sounds: Normal heart sounds. No murmur heard.    No Lower extremity edema noted Pulmonary:     Effort: Pulmonary effort is normal. No respiratory distress.     Breath sounds: No wheezing, rhonchi or rales.  Abdominal:     General: Bowel sounds are normal. There is no distension.     Tenderness: There is no abdominal tenderness.  Musculoskeletal:        General: Normal range of motion.     Neck:  No rigidity.  Skin:    General: Skin is warm and dry.  Psychiatric:        Mood and Affect: Mood normal.   No orders to display      EKG: Normal sinus rhythm Vent Rate:  129 NonspecificT waves    NO ST elevation or Depression No Q waves PR Interval in ms:    QRS Duration in ms:  98  QT/Qtc-Baz in ms:  302/442  P-R-T axes:  44/112   No previous EKG to compare with           DIAGNOSIS/PLAN     1. Shortness of breath  POC Influenza A&B NAT (IDNOW)   POC SARS-COV-2 SYMPTOMATIC (IDNOW)    2. Palpitations  ECG 12 lead    3. Atrial fibrillation, unspecified type (CMD)        Assessment & Plan Initial Assessment: Concise summary of the initial assessment.  Final Assessment: New onset of atrial fibrillation. Recommended immediate transfer to the emergency room via EMS. Patient agreed with the plan.  Clinical Impression: - New onset of atrial fibrillation  Disposition: - Transfer: Immediate transfer to the emergency room via EMS - Follow-Up: Return for follow-up if any problems or concerns  Portions of this note were created using the aid of voice recognition Dragon/DAX dictation software.   We discussed risks and side effects of medications, and also discussed red flags which would warrant  immediate follow-up.   Urgent Care Disposition:  Follow up with ED

## 2024-06-29 NOTE — ED Triage Notes (Signed)
 Pt arrived from UC via [facility] s/p Afib RVR event at [facility] caught on their EKG brought copy with pt. Afib has resolved to NS at present as well as all symptoms.

## 2024-06-30 ENCOUNTER — Emergency Department (HOSPITAL_COMMUNITY)

## 2024-06-30 LAB — TROPONIN T, HIGH SENSITIVITY
Troponin T High Sensitivity: <15 ng/L (ref 0–19)
Troponin T High Sensitivity: <15 ng/L (ref 0–19)

## 2024-06-30 LAB — BASIC METABOLIC PANEL WITH GFR
Anion gap: 11 (ref 5–15)
BUN: 25 mg/dL — ABNORMAL HIGH (ref 6–20)
CO2: 29 mmol/L (ref 22–32)
Calcium: 9.8 mg/dL (ref 8.9–10.3)
Chloride: 102 mmol/L (ref 98–111)
Creatinine, Ser: 0.74 mg/dL (ref 0.44–1.00)
GFR, Estimated: >60 mL/min
Glucose, Bld: 111 mg/dL — ABNORMAL HIGH (ref 70–99)
Potassium: 4.4 mmol/L (ref 3.5–5.1)
Sodium: 141 mmol/L (ref 135–145)

## 2024-06-30 LAB — CBC
HCT: 45.7 % (ref 36.0–46.0)
Hemoglobin: 15.4 g/dL — ABNORMAL HIGH (ref 12.0–15.0)
MCH: 31 pg (ref 26.0–34.0)
MCHC: 33.7 g/dL (ref 30.0–36.0)
MCV: 92.1 fL (ref 80.0–100.0)
Platelets: 260 K/uL (ref 150–400)
RBC: 4.96 MIL/uL (ref 3.87–5.11)
RDW: 12.4 % (ref 11.5–15.5)
WBC: 8.2 K/uL (ref 4.0–10.5)
nRBC: 0 % (ref 0.0–0.2)

## 2024-06-30 NOTE — ED Provider Triage Note (Addendum)
 Emergency Medicine Provider Triage Evaluation Note  Taneisha Fuson , a 59 y.o. female  was evaluated in triage.  Pt complains of palpitations that started while watching tv at 1915. Lasted several hours. No CP nor shob with palpitations. Has had this happen at night in past a couple mo ago but didn't get evaluated. No cardiologist. No recent infectious sx. No increased caffeine use nor med changes. Drinks plenty of water. No tobacco use  Was originally evaluated at [facility] who recommended ED evaluation   Review of Systems  Positive: See hpi Negative:  Physical Exam  BP 130/75 (BP Location: Right Arm)   Pulse 74   Temp 98.6 F (37 C)   Resp 18   Ht 5' 3 (1.6 m)   Wt 67.1 kg   LMP  (LMP Unknown)   SpO2 99%   BMI 26.22 kg/m  Gen:   Awake, no distress   Resp:  Normal effort  MSK:   Moves extremities without difficulty  Other:  No pedal edema  Medical Decision Making  Medically screening exam initiated at 12:37 AM.  Appropriate orders placed.  Ezma Rehm was informed that the remainder of the evaluation will be completed by another provider, this initial triage assessment does not replace that evaluation, and the importance of remaining in the ED until their evaluation is complete.  Cardiac workup started. EKG NSR currently. CXR wo abnormalities. No complaints of CP, SHOB   Minnie Tinnie BRAVO, PA 06/30/24 0040    Minnie Tinnie BRAVO, PA 06/30/24 [phone]

## 2024-06-30 NOTE — ED Notes (Signed)
 Pt asked me to leave, stated they will follow up with their regular dr, moved [facility]

## 2024-07-06 ENCOUNTER — Ambulatory Visit: Attending: Internal Medicine | Admitting: Internal Medicine

## 2024-07-06 ENCOUNTER — Other Ambulatory Visit: Payer: Self-pay

## 2024-07-06 VITALS — BP 136/60 | HR 62 | Ht 64.0 in | Wt 153.6 lb

## 2024-07-06 DIAGNOSIS — I4891 Unspecified atrial fibrillation: Secondary | ICD-10-CM | POA: Insufficient documentation

## 2024-07-06 DIAGNOSIS — R002 Palpitations: Secondary | ICD-10-CM | POA: Diagnosis not present

## 2024-07-06 DIAGNOSIS — E7849 Other hyperlipidemia: Secondary | ICD-10-CM | POA: Insufficient documentation

## 2024-07-06 MED ORDER — DILTIAZEM HCL 30 MG PO TABS: 30.0000 mg | ORAL_TABLET | Freq: Four times a day (QID) | ORAL | 6 refills | Status: AC | PRN

## 2024-07-06 NOTE — Patient Instructions (Signed)
 Medication Instructions:  Your physician has recommended you make the following change in your medication:   START: Diltiazem  30 mg by mouth every 6 hours as needed for palpitations  *If you need a refill on your cardiac medications before your next appointment, please call your pharmacy*  Lab Work: NONE   Testing/Procedures: Your physician has requested that you wear a heart monitor.    Follow-Up: At [facility], you and your health needs are our priority.  As part of our continuing mission to provide you with exceptional heart care, our providers are all part of one team.  This team includes your primary Cardiologist (physician) and Advanced Practice Providers or APPs (Physician Assistants and Nurse Practitioners) who all work together to provide you with the care you need, when you need it.  Your next appointment:   3 month(s)  Provider:   Stanly DELENA Leavens, MD     Other Instructions   [employer] Cardiac Event Monitor Instructions  Your physician has requested you wear your cardiac event monitor for  30 days. [employer] may call or text to confirm a shipping address. The monitor will be sent to a land address via UPS. [employer] will not ship a monitor to a PO BOX. It typically takes 3-5 days to receive your monitor after it has been enrolled. [employer] will assist with USPS tracking if your package is delayed. The telephone number for [employer] is [phone]. Once you have received your monitor, please review the enclosed instructions. Instruction tutorials can also be viewed under help and settings on the enclosed cell phone. Your monitor has already been registered assigning a specific monitor serial # to you.  Billing and Self Pay Discount Information  [employer] has been provided the insurance information we had on file for you.  If your insurance has been updated, please call [employer] at [phone] to provide them with your updated insurance  information.   [employer] offers a discounted Self Pay option for patients who have insurance that does not cover their cardiac event monitor or patients without insurance.  The discounted cost of a Self Pay Cardiac Event Monitor would be $225.00 , if the patient contacts [employer] at [phone] within 7 days of applying the monitor to make payment arrangements.  If the patient does not contact [employer] within 7 days of applying the monitor, the cost of the cardiac event monitor will be $350.00.  Applying the monitor  Remove cell phone from case and turn it on. The cell phone works as it consultant and needs to be within [employer] of you at all times. The cell phone will need to be charged on a daily basis. We recommend you plug the cell phone into the enclosed charger at your bedside table every night.  Monitor batteries: You will receive two monitor batteries labelled #1 and #2. These are your recorders. Plug battery #2 onto the second connection on the enclosed charger. Keep one battery on the charger at all times. This will keep the monitor battery deactivated. It will also keep it fully charged for when you need to switch your monitor batteries. A small light will be blinking on the battery emblem when it is charging. The light on the battery emblem will remain on when the battery is fully charged.  Open package of a Monitor strip. Insert battery #1 into black hood on strip and gently squeeze monitor battery onto connection as indicated in instruction booklet. Set aside while preparing skin.  Choose location for your strip,  vertical or horizontal, as indicated in the instruction booklet. Shave to remove all hair from location. There cannot be any lotions, oils, powders, or colognes on skin where monitor is to be applied. Wipe skin clean with enclosed Saline wipe. Dry skin completely.  Peel paper labeled #1 off the back of the Monitor strip exposing the adhesive. Place the monitor on  the chest in the vertical or horizontal position shown in the instruction booklet. One arrow on the monitor strip must be pointing upward. Carefully remove paper labeled #2, attaching remainder of strip to your skin. Try not to create any folds or wrinkles in the strip as you apply it.  Firmly press and release the circle in the center of the monitor battery. You will hear a small beep. This is turning the monitor battery on. The heart emblem on the monitor battery will light up every 5 seconds if the monitor battery in turned on and connected to the patient securely. Do not push and hold the circle down as this turns the monitor battery off. The cell phone will locate the monitor battery. A screen will appear on the cell phone checking the connection of your monitor strip. This may read poor connection initially but change to good connection within the next minute. Once your monitor accepts the connection you will hear a series of 3 beeps followed by a climbing crescendo of beeps. A screen will appear on the cell phone showing the two monitor strip placement options. Touch the picture that demonstrates where you applied the monitor strip.  Your monitor strip and battery are waterproof. You are able to shower, bathe, or swim with the monitor on. They just ask you do not submerge deeper than 3 feet underwater. We recommend removing the monitor if you are swimming in a lake, river, or ocean.  Your monitor battery will need to be switched to a fully charged monitor battery approximately once a week. The cell phone will alert you of an action which needs to be made.  On the cell phone, tap for details to reveal connection status, monitor battery status, and cell phone battery status. The green dots indicates your monitor is in good status. A red dot indicates there is something that needs your attention.  To record a symptom, click the circle on the monitor battery. In 30-60 seconds a list of  symptoms will appear on the cell phone. Select your symptom and tap save. Your monitor will record a sustained or significant arrhythmia regardless of you clicking the button. Some patients do not feel the heart rhythm irregularities. [employer] will notify us  of any serious or critical events.  Refer to instruction booklet for instructions on switching batteries, changing strips, the Do not disturb or Pause features, or any additional questions.  Call [employer] at [phone], to confirm your monitor is transmitting and record your baseline. They will answer any questions you may have regarding the monitor instructions at that time.  Returning the monitor to [employer]  Place all equipment back into blue box. Peel off strip of paper to expose adhesive and close box securely. There is a prepaid UPS shipping label on this box. Drop in a UPS drop box, or at a UPS facility like [employer]. You may also contact [employer] to arrange UPS to pick up monitor package at your home.

## 2024-07-06 NOTE — Progress Notes (Signed)
 " Cardiology Office Note:  .    Date:  07/06/2024  ID:  Emily Jarvis, DOB May 08, 1966, MRN [id number] PCP: System, Provider Not In  [facility] Providers Cardiologist:  Stanly DELENA Leavens, MD     CC: AF Consulted for the evaluation of AF at the behest of Dr. Seabron   History of Present Illness: .    Emily Jarvis is a 59 y.o. female who presents with palpitations and paroxysmal atrial fibrillation. She was referred by Dr. Seabron for evaluation of atrial fibrillation.  She began experiencing palpitations two months ago, initially waking her in the middle of the night with a rapid heartbeat lasting several hours, reaching a heart rate of 139 beats per minute. The episode resolved spontaneously, and she did not seek medical attention at that time.  Last Monday, she experienced another episode while watching TV, with her heart rate reaching 129 beats per minute and blood pressure rising to 200/167 mmHg. Her husband took her to urgent care, where an EKG was noted by her PA as AF (documented as SR). She was transported to [facility], where another EKG showed a return to sinus rhythm. Blood work was performed, and she was discharged later that morning.  She has been started on aspirin following her visit with her new PCP. She is currently taking aspirin.  No history of congestive heart failure, hypertension, diabetes, stroke, or coronary artery disease. No current symptoms of chest pain, shortness of breath, swelling, or syncope, aside from the palpitations.  Her family history includes her father having stents placed, but no known family history of atrial fibrillation. Her sister has been diagnosed with PVCs and PACs, and had an equivocal stress test in 2024. Her sister does not take medication for her arrhythmia but sees a cardiologist annually.  Discussed the use of AI scribe software for clinical note transcription with the patient, who gave verbal consent to  proceed.   Relevant histories: .  Social  - FHX of CAD, PVCs ROS: As per HPI.    Physical Exam:    VS:  BP 136/60 (BP Location: Right Arm)   Pulse 62   Ht 5' 4 (1.626 m)   Wt 153 lb 9.6 oz (69.7 kg)   LMP  (LMP Unknown)   SpO2 96%   BMI 26.37 kg/m    Wt Readings from Last 3 Encounters:  07/06/24 153 lb 9.6 oz (69.7 kg)  06/29/24 148 lb (67.1 kg)  04/22/23 152 lb (68.9 kg)    Gen: No distress Cardiac: No Rubs or Gallops, no Murmur, RRR +2 radial pulses Respiratory: Clear to auscultation bilaterally, normal effort, normal  respiratory rate GI: Soft, nontender, non-distended  MS: No  edema;  moves all extremities Integument: Skin feels warm Neuro:  At time of evaluation, alert and oriented to person/place/time/situation  Psych: Normal affect, patient feels ok       ASSESSMENT AND PLAN: .     An EKG was ordered for AF and shows SR  Palpitations and possible Paroxysmal atrial fibrillation Intermittent episodes of palpitations and elevated heart rate over the past two months, with two significant episodes. Initial EKGs showed sinus rhythm, but urgent care suggested atrial fibrillation. Differential diagnosis includes atrial flutter, atrial tachycardia, AVNRT, or other atrial arrhythmias. No history of congestive heart failure, hypertension, diabetes, stroke, or bleeding issues. Family history of coronary artery disease and sister with PVCs and PACs. Current management includes aspirin. Discussed potential need for anticoagulation if atrial fibrillation is confirmed  and CHADS-VASc score is 2 or greater. Risks of anticoagulation include bleeding, but overall risk is relatively low. Diltiazem  prescribed as needed for symptomatic relief and potential arrhythmia management. - Ordered heart monitor for 30 days to document arrhythmia. - Prescribed diltiazem  30 mg PO PRN for palpitations. - Continue aspirin therapy. - If atrial fibrillation or flutter is documented, will consider  anticoagulation and further testing including TSH, echocardiogram, and potential coronary artery calcium score. - If supraventricular tachycardia is documented, will consider standing dose of diltiazem  (120 mg PO XL) - If no arrhythmia is documented, will reassess in 3 months, keep ASA and dilatizem  Hyperlipidemia Slightly elevated cholesterol levels. Family history of coronary artery disease. Discussed potential need for more aggressive cholesterol management based on coronary artery calcium score results. - Will consider coronary artery calcium score to assess need for more aggressive cholesterol management.  Three months with me or my team  Stanly Leavens, MD [facility] Cardi[facility]  [address] [place], [address] [place], [place] [address] [phone]  5:15 PM  "

## 2024-07-07 ENCOUNTER — Encounter: Payer: Self-pay | Admitting: *Deleted

## 2024-07-07 NOTE — Progress Notes (Signed)
 Patient enrolled for [employer] / [employer] to ship a 30 day cardiac event monitor to her address on file.

## 2024-10-07 ENCOUNTER — Ambulatory Visit: Admitting: Internal Medicine
